# Patient Record
Sex: Female | Born: 1965 | Race: Black or African American | Hispanic: No | Marital: Married | Smoking: Never smoker
Health system: Southern US, Community
[De-identification: ages and names within clinical notes are randomized; demographics above are authoritative.]

## PROBLEM LIST (undated history)

## (undated) DIAGNOSIS — G43909 Migraine, unspecified, not intractable, without status migrainosus: Secondary | ICD-10-CM

## (undated) DIAGNOSIS — E669 Obesity, unspecified: Secondary | ICD-10-CM

## (undated) DIAGNOSIS — D259 Leiomyoma of uterus, unspecified: Secondary | ICD-10-CM

## (undated) DIAGNOSIS — G4733 Obstructive sleep apnea (adult) (pediatric): Secondary | ICD-10-CM

## (undated) DIAGNOSIS — O3410 Maternal care for benign tumor of corpus uteri, unspecified trimester: Secondary | ICD-10-CM

## (undated) DIAGNOSIS — K219 Gastro-esophageal reflux disease without esophagitis: Secondary | ICD-10-CM

## (undated) DIAGNOSIS — J40 Bronchitis, not specified as acute or chronic: Secondary | ICD-10-CM

## (undated) DIAGNOSIS — N83209 Unspecified ovarian cyst, unspecified side: Secondary | ICD-10-CM

## (undated) HISTORY — DX: Obesity, unspecified: E66.9

## (undated) HISTORY — DX: Bronchitis, not specified as acute or chronic: J40

## (undated) HISTORY — DX: Obstructive sleep apnea (adult) (pediatric): G47.33

---

## 2002-09-26 ENCOUNTER — Ambulatory Visit (HOSPITAL_COMMUNITY): Admission: RE | Admit: 2002-09-26 | Discharge: 2002-09-26 | Payer: Self-pay | Admitting: Internal Medicine

## 2002-09-26 ENCOUNTER — Encounter: Payer: Self-pay | Admitting: Internal Medicine

## 2003-12-12 ENCOUNTER — Other Ambulatory Visit: Admission: RE | Admit: 2003-12-12 | Discharge: 2003-12-12 | Payer: Self-pay | Admitting: Internal Medicine

## 2004-02-14 ENCOUNTER — Emergency Department (HOSPITAL_COMMUNITY): Admission: EM | Admit: 2004-02-14 | Discharge: 2004-02-15 | Payer: Self-pay | Admitting: Emergency Medicine

## 2004-04-02 ENCOUNTER — Ambulatory Visit: Payer: Self-pay | Admitting: Internal Medicine

## 2004-04-16 ENCOUNTER — Ambulatory Visit: Payer: Self-pay | Admitting: Internal Medicine

## 2004-05-06 ENCOUNTER — Ambulatory Visit: Payer: Self-pay | Admitting: Internal Medicine

## 2004-05-06 ENCOUNTER — Ambulatory Visit: Payer: Self-pay | Admitting: *Deleted

## 2004-09-28 ENCOUNTER — Emergency Department (HOSPITAL_COMMUNITY): Admission: EM | Admit: 2004-09-28 | Discharge: 2004-09-28 | Payer: Self-pay | Admitting: Emergency Medicine

## 2006-11-29 ENCOUNTER — Encounter: Admission: RE | Admit: 2006-11-29 | Discharge: 2006-11-29 | Payer: Self-pay | Admitting: Cardiology

## 2009-10-10 ENCOUNTER — Inpatient Hospital Stay (HOSPITAL_COMMUNITY): Admission: AD | Admit: 2009-10-10 | Discharge: 2009-10-10 | Payer: Self-pay | Admitting: Obstetrics & Gynecology

## 2009-10-12 ENCOUNTER — Ambulatory Visit: Payer: Self-pay | Admitting: Family

## 2009-10-12 ENCOUNTER — Inpatient Hospital Stay (HOSPITAL_COMMUNITY): Admission: AD | Admit: 2009-10-12 | Discharge: 2009-10-13 | Payer: Self-pay | Admitting: Obstetrics & Gynecology

## 2009-10-26 DEATH — deceased

## 2009-11-01 ENCOUNTER — Ambulatory Visit: Payer: Self-pay | Admitting: Obstetrics & Gynecology

## 2009-11-02 ENCOUNTER — Encounter: Payer: Self-pay | Admitting: Obstetrics & Gynecology

## 2009-11-02 LAB — CONVERTED CEMR LAB
Trich, Wet Prep: NONE SEEN
Yeast Wet Prep HPF POC: NONE SEEN

## 2010-08-18 ENCOUNTER — Encounter: Payer: Self-pay | Admitting: Obstetrics & Gynecology

## 2010-10-16 ENCOUNTER — Inpatient Hospital Stay (HOSPITAL_COMMUNITY)
Admission: AD | Admit: 2010-10-16 | Discharge: 2010-10-16 | Disposition: A | Discharge status: Home / Self Care | Payer: BC Managed Care – PPO | Source: Ambulatory Visit | Attending: Obstetrics and Gynecology | Admitting: Obstetrics and Gynecology

## 2010-10-16 DIAGNOSIS — O99891 Other specified diseases and conditions complicating pregnancy: Secondary | ICD-10-CM | POA: Insufficient documentation

## 2010-10-16 DIAGNOSIS — O09529 Supervision of elderly multigravida, unspecified trimester: Secondary | ICD-10-CM | POA: Insufficient documentation

## 2010-10-16 DIAGNOSIS — N949 Unspecified condition associated with female genital organs and menstrual cycle: Secondary | ICD-10-CM | POA: Insufficient documentation

## 2010-10-16 LAB — URINALYSIS, ROUTINE W REFLEX MICROSCOPIC
Glucose, UA: NEGATIVE mg/dL
Nitrite: NEGATIVE
Urobilinogen, UA: 0.2 mg/dL (ref 0.0–1.0)
pH: 6 (ref 5.0–8.0)

## 2010-10-17 LAB — URINE CULTURE

## 2010-10-21 LAB — CBC
HCT: 31.6 % — ABNORMAL LOW (ref 36.0–46.0)
HCT: 32.5 % — ABNORMAL LOW (ref 36.0–46.0)
Hemoglobin: 11 g/dL — ABNORMAL LOW (ref 12.0–15.0)
MCHC: 33.7 g/dL (ref 30.0–36.0)
MCHC: 33.9 g/dL (ref 30.0–36.0)
MCV: 96 fL (ref 78.0–100.0)
Platelets: 244 10*3/uL (ref 150–400)
RBC: 3.29 MIL/uL — ABNORMAL LOW (ref 3.87–5.11)
RDW: 12.2 % (ref 11.5–15.5)
WBC: 5.7 10*3/uL (ref 4.0–10.5)

## 2010-10-21 LAB — URINALYSIS, ROUTINE W REFLEX MICROSCOPIC
Glucose, UA: 500 mg/dL — AB
Leukocytes, UA: NEGATIVE
Nitrite: NEGATIVE
Protein, ur: NEGATIVE mg/dL
Urobilinogen, UA: 0.2 mg/dL (ref 0.0–1.0)

## 2010-10-21 LAB — WET PREP, GENITAL: Trich, Wet Prep: NONE SEEN

## 2010-10-21 LAB — HCG, QUANTITATIVE, PREGNANCY: hCG, Beta Chain, Quant, S: 954 m[IU]/mL — ABNORMAL HIGH (ref <5)

## 2010-10-21 LAB — URINE MICROSCOPIC-ADD ON

## 2010-11-08 ENCOUNTER — Inpatient Hospital Stay (HOSPITAL_COMMUNITY)
Admission: AD | Admit: 2010-11-08 | Discharge: 2010-11-09 | Disposition: A | Discharge status: Home / Self Care | Payer: BC Managed Care – PPO | Source: Ambulatory Visit | Attending: Obstetrics and Gynecology | Admitting: Obstetrics and Gynecology

## 2010-11-08 DIAGNOSIS — D259 Leiomyoma of uterus, unspecified: Secondary | ICD-10-CM | POA: Insufficient documentation

## 2010-11-08 DIAGNOSIS — R109 Unspecified abdominal pain: Secondary | ICD-10-CM | POA: Insufficient documentation

## 2010-11-08 DIAGNOSIS — O3410 Maternal care for benign tumor of corpus uteri, unspecified trimester: Secondary | ICD-10-CM | POA: Insufficient documentation

## 2010-11-09 ENCOUNTER — Inpatient Hospital Stay (HOSPITAL_COMMUNITY): Payer: BC Managed Care – PPO

## 2010-11-09 LAB — URINALYSIS, ROUTINE W REFLEX MICROSCOPIC
Bilirubin Urine: NEGATIVE
Glucose, UA: 100 mg/dL — AB
Hgb urine dipstick: NEGATIVE
Nitrite: NEGATIVE
Specific Gravity, Urine: >1.03 — ABNORMAL HIGH (ref 1.005–1.030)
Urobilinogen, UA: 0.2 mg/dL (ref 0.0–1.0)

## 2010-11-19 ENCOUNTER — Other Ambulatory Visit: Payer: Self-pay | Admitting: Obstetrics and Gynecology

## 2010-11-19 DIAGNOSIS — R748 Abnormal levels of other serum enzymes: Secondary | ICD-10-CM

## 2010-11-20 ENCOUNTER — Ambulatory Visit
Admission: RE | Admit: 2010-11-20 | Discharge: 2010-11-20 | Disposition: A | Discharge status: Home / Self Care | Payer: BC Managed Care – PPO | Source: Ambulatory Visit | Attending: Obstetrics and Gynecology | Admitting: Obstetrics and Gynecology

## 2010-11-20 DIAGNOSIS — R748 Abnormal levels of other serum enzymes: Secondary | ICD-10-CM

## 2011-01-30 ENCOUNTER — Inpatient Hospital Stay (HOSPITAL_COMMUNITY)
Admission: EM | Admit: 2011-01-30 | Discharge: 2011-02-04 | DRG: 651 | Disposition: A | Discharge status: Home / Self Care | Payer: BC Managed Care – PPO | Source: Ambulatory Visit | Attending: Obstetrics and Gynecology | Admitting: Obstetrics and Gynecology

## 2011-01-30 DIAGNOSIS — O09529 Supervision of elderly multigravida, unspecified trimester: Secondary | ICD-10-CM | POA: Diagnosis present

## 2011-01-30 DIAGNOSIS — D4959 Neoplasm of unspecified behavior of other genitourinary organ: Secondary | ICD-10-CM | POA: Diagnosis present

## 2011-01-30 DIAGNOSIS — O1495 Unspecified pre-eclampsia, complicating the puerperium: Secondary | ICD-10-CM | POA: Diagnosis present

## 2011-01-30 DIAGNOSIS — IMO0002 Reserved for concepts with insufficient information to code with codable children: Principal | ICD-10-CM | POA: Diagnosis present

## 2011-01-30 DIAGNOSIS — D259 Leiomyoma of uterus, unspecified: Secondary | ICD-10-CM | POA: Diagnosis present

## 2011-01-30 DIAGNOSIS — O34599 Maternal care for other abnormalities of gravid uterus, unspecified trimester: Secondary | ICD-10-CM | POA: Diagnosis present

## 2011-01-30 DIAGNOSIS — O1494 Unspecified pre-eclampsia, complicating childbirth: Secondary | ICD-10-CM

## 2011-01-30 HISTORY — DX: Unspecified ovarian cyst, unspecified side: N83.209

## 2011-01-30 HISTORY — DX: Leiomyoma of uterus, unspecified: D25.9

## 2011-01-30 HISTORY — DX: Maternal care for benign tumor of corpus uteri, unspecified trimester: O34.10

## 2011-01-30 HISTORY — DX: Gastro-esophageal reflux disease without esophagitis: K21.9

## 2011-01-30 LAB — COMPREHENSIVE METABOLIC PANEL
Albumin: 2.7 g/dL — ABNORMAL LOW (ref 3.5–5.2)
Alkaline Phosphatase: 142 U/L — ABNORMAL HIGH (ref 39–117)
Calcium: 9.9 mg/dL (ref 8.4–10.5)
Chloride: 105 mEq/L (ref 96–112)
Creatinine, Ser: 0.92 mg/dL (ref 0.50–1.10)
GFR calc non Af Amer: >60 mL/min (ref >60)
Potassium: 4 mEq/L (ref 3.5–5.1)
Total Bilirubin: 0.2 mg/dL — ABNORMAL LOW (ref 0.3–1.2)
Total Protein: 6.3 g/dL (ref 6.0–8.3)

## 2011-01-30 LAB — CBC
HCT: 34.5 % — ABNORMAL LOW (ref 36.0–46.0)
MCH: 31 pg (ref 26.0–34.0)
MCV: 93 fL (ref 78.0–100.0)
RBC: 3.71 MIL/uL — ABNORMAL LOW (ref 3.87–5.11)
RDW: 12.9 % (ref 11.5–15.5)
WBC: 7.6 10*3/uL (ref 4.0–10.5)

## 2011-01-31 ENCOUNTER — Other Ambulatory Visit: Payer: Self-pay | Admitting: Obstetrics and Gynecology

## 2011-01-31 LAB — COMPREHENSIVE METABOLIC PANEL
ALT: 13 U/L (ref 0–35)
AST: 14 U/L (ref 0–37)
Albumin: 2.2 g/dL — ABNORMAL LOW (ref 3.5–5.2)
Alkaline Phosphatase: 117 U/L (ref 39–117)
Calcium: 9.2 mg/dL (ref 8.4–10.5)
GFR calc Af Amer: >60 mL/min (ref >60)
Glucose, Bld: 72 mg/dL (ref 70–99)
Potassium: 3.6 mEq/L (ref 3.5–5.1)
Sodium: 136 mEq/L (ref 135–145)
Total Protein: 5.9 g/dL — ABNORMAL LOW (ref 6.0–8.3)

## 2011-01-31 LAB — CREATININE CLEARANCE, URINE, 24 HOUR
Collection Interval-CRCL: 24 hours
Creatinine Clearance: 138 mL/min — ABNORMAL HIGH (ref 75–115)
Creatinine, 24H Ur: 1867 mg/d — ABNORMAL HIGH (ref 700–1800)
Creatinine, Urine: 116.7 mg/dL
Creatinine: 0.94 mg/dL (ref 0.50–1.10)

## 2011-01-31 LAB — CBC
HCT: 33.3 % — ABNORMAL LOW (ref 36.0–46.0)
Hemoglobin: 10.5 g/dL — ABNORMAL LOW (ref 12.0–15.0)
Hemoglobin: 11.3 g/dL — ABNORMAL LOW (ref 12.0–15.0)
MCH: 31.6 pg (ref 26.0–34.0)
MCHC: 33.4 g/dL (ref 30.0–36.0)
MCHC: 33.9 g/dL (ref 30.0–36.0)
MCV: 93 fL (ref 78.0–100.0)
Platelets: 189 10*3/uL (ref 150–400)
RDW: 12.8 % (ref 11.5–15.5)

## 2011-01-31 LAB — TYPE AND SCREEN: Antibody Screen: NEGATIVE

## 2011-01-31 MED ORDER — FLORA-Q PO CAPS
1.0000 | ORAL_CAPSULE | Freq: Every day | ORAL | Status: DC
  Filled 2011-01-31: qty 1

## 2011-01-31 MED ORDER — POLYSACCHARIDE IRON 150 MG PO CAPS
150.0000 mg | ORAL_CAPSULE | Freq: Two times a day (BID) | ORAL | Status: DC
  Filled 2011-01-31: qty 1

## 2011-01-31 MED ORDER — PRENATAL PLUS 27-1 MG PO TABS: 1.0000 | ORAL_TABLET | Freq: Every day | ORAL | Status: DC

## 2011-01-31 MED ORDER — MAGNESIUM SULFATE 40 G IN LACTATED RINGERS - SIMPLE
2.0000 g/h | INTRAVENOUS | Status: DC
  Filled 2011-01-31: qty 500

## 2011-01-31 MED ORDER — LABETALOL HCL 100 MG PO TABS
100.0000 mg | ORAL_TABLET | Freq: Two times a day (BID) | ORAL | Status: DC
  Filled 2011-01-31: qty 1

## 2011-01-31 MED ORDER — PANTOPRAZOLE SODIUM 40 MG PO TBEC
40.0000 mg | DELAYED_RELEASE_TABLET | Freq: Two times a day (BID) | ORAL | Status: DC
  Filled 2011-01-31: qty 1

## 2011-01-31 MED ORDER — DOCUSATE SODIUM 100 MG PO CAPS: 100.0000 mg | ORAL_CAPSULE | Freq: Every day | ORAL | Status: DC

## 2011-01-31 MED ORDER — DEXTROSE IN LACTATED RINGERS 5 % IV SOLN: INTRAVENOUS | Status: DC

## 2011-01-31 MED ORDER — ZOLPIDEM TARTRATE 10 MG PO TABS: 10.0000 mg | ORAL_TABLET | Freq: Every evening | ORAL | Status: DC | PRN

## 2011-01-31 MED ORDER — SODIUM CHLORIDE 0.9 % IJ SOLN
3.0000 mL | Freq: Two times a day (BID) | INTRAMUSCULAR | Status: DC
  Filled 2011-01-31: qty 3

## 2011-01-31 MED ORDER — OXYTOCIN 20 UNITS IN LACTATED RINGERS INFUSION - SIMPLE: 1.0000 m[IU]/min | Freq: Once | INTRAVENOUS | Status: DC

## 2011-01-31 MED ORDER — FAMOTIDINE 20 MG PO TABS: 20.0000 mg | ORAL_TABLET | Freq: Two times a day (BID) | ORAL | Status: DC

## 2011-01-31 MED ORDER — CALCIUM CARBONATE ANTACID 500 MG PO CHEW: 2.0000 | CHEWABLE_TABLET | ORAL | Status: DC | PRN

## 2011-01-31 MED ORDER — ACETAMINOPHEN 325 MG PO TABS: 650.0000 mg | ORAL_TABLET | ORAL | Status: DC | PRN

## 2011-01-31 MED ORDER — ONDANSETRON HCL 4 MG PO TABS: 8.0000 mg | ORAL_TABLET | Freq: Two times a day (BID) | ORAL | Status: DC | PRN

## 2011-01-31 MED ORDER — SODIUM CHLORIDE 0.9 % IV SOLN: 8.0000 mg | Freq: Two times a day (BID) | INTRAVENOUS | Status: DC

## 2011-02-01 DIAGNOSIS — O1495 Unspecified pre-eclampsia, complicating the puerperium: Secondary | ICD-10-CM | POA: Diagnosis present

## 2011-02-01 DIAGNOSIS — O1494 Unspecified pre-eclampsia, complicating childbirth: Secondary | ICD-10-CM | POA: Diagnosis present

## 2011-02-01 LAB — CBC
HCT: 32.9 % — ABNORMAL LOW (ref 36.0–46.0)
MCHC: 33.7 g/dL (ref 30.0–36.0)
MCV: 92.4 fL (ref 78.0–100.0)
RDW: 12.6 % (ref 11.5–15.5)

## 2011-02-01 LAB — MRSA PCR SCREENING: MRSA by PCR: NEGATIVE

## 2011-02-01 MED ORDER — MENTHOL 3 MG MT LOZG: 1.0000 | LOZENGE | OROMUCOSAL | Status: DC | PRN

## 2011-02-01 MED ORDER — GUAIFENESIN 100 MG/5ML PO SOLN: 15.0000 mL | ORAL | Status: DC | PRN

## 2011-02-01 MED ORDER — ZOLPIDEM TARTRATE 5 MG PO TABS: 5.0000 mg | ORAL_TABLET | Freq: Every evening | ORAL | Status: DC | PRN

## 2011-02-01 MED ORDER — ONDANSETRON HCL 4 MG/2ML IJ SOLN: 4.0000 mg | Freq: Three times a day (TID) | INTRAMUSCULAR | Status: DC | PRN

## 2011-02-01 MED ORDER — DIPHENHYDRAMINE HCL 25 MG PO CAPS: 25.0000 mg | ORAL_CAPSULE | ORAL | Status: DC | PRN

## 2011-02-01 MED ORDER — IBUPROFEN 600 MG PO TABS
600.0000 mg | ORAL_TABLET | Freq: Four times a day (QID) | ORAL | Status: DC
  Administered 2011-02-01 – 2011-02-03 (×9): 600 mg via ORAL
  Filled 2011-02-01 (×2): qty 1

## 2011-02-01 MED ORDER — KETOROLAC TROMETHAMINE 30 MG/ML IJ SOLN: 30.0000 mg | Freq: Four times a day (QID) | INTRAMUSCULAR | Status: AC | PRN

## 2011-02-01 MED ORDER — RHO D IMMUNE GLOBULIN 1500 UNIT/2ML IJ SOLN
300.0000 ug | Freq: Once | INTRAMUSCULAR | Status: DC
  Filled 2011-02-01: qty 2

## 2011-02-01 MED ORDER — NALBUPHINE HCL 10 MG/ML IJ SOLN
5.0000 mg | INTRAMUSCULAR | Status: AC | PRN
  Filled 2011-02-01: qty 1

## 2011-02-01 MED ORDER — DIBUCAINE 1 % RE OINT: TOPICAL_OINTMENT | RECTAL | Status: DC | PRN

## 2011-02-01 MED ORDER — NALOXONE HCL 0.4 MG/ML IJ SOLN
1.0000 ug/kg/h | INTRAVENOUS | Status: DC | PRN
  Filled 2011-02-01: qty 2.5

## 2011-02-01 MED ORDER — DIPHENHYDRAMINE HCL 50 MG/ML IJ SOLN: 25.0000 mg | INTRAMUSCULAR | Status: DC | PRN

## 2011-02-01 MED ORDER — DIPHENHYDRAMINE HCL 25 MG PO CAPS
25.0000 mg | ORAL_CAPSULE | Freq: Four times a day (QID) | ORAL | Status: DC | PRN
Start: 2011-02-01 — End: 2011-02-04

## 2011-02-01 MED ORDER — MAGNESIUM SULFATE 40 G IN LACTATED RINGERS - SIMPLE
2.0000 g/h | INTRAVENOUS | Status: DC
  Administered 2011-02-01 – 2011-02-03 (×4): 2 g/h via INTRAVENOUS
  Filled 2011-02-01 (×2): qty 500

## 2011-02-01 MED ORDER — LACTATED RINGERS IV SOLN
INTRAVENOUS | Status: DC
  Administered 2011-02-01 – 2011-02-02 (×3): via INTRAVENOUS

## 2011-02-01 MED ORDER — CALCIUM CARBONATE ANTACID 500 MG PO CHEW: 1.0000 | CHEWABLE_TABLET | ORAL | Status: DC | PRN

## 2011-02-01 MED ORDER — LABETALOL HCL 100 MG PO TABS
100.0000 mg | ORAL_TABLET | Freq: Two times a day (BID) | ORAL | Status: DC
  Administered 2011-02-01 – 2011-02-02 (×4): 100 mg via ORAL
  Filled 2011-02-01 (×5): qty 1

## 2011-02-01 MED ORDER — SCOPOLAMINE 1 MG/3DAYS TD PT72
1.0000 | MEDICATED_PATCH | Freq: Once | TRANSDERMAL | Status: DC
  Filled 2011-02-01: qty 1

## 2011-02-01 MED ORDER — NALOXONE HCL 0.4 MG/ML IJ SOLN: 0.4000 mg | INTRAMUSCULAR | Status: DC | PRN

## 2011-02-01 MED ORDER — TETANUS-DIPHTH-ACELL PERTUSSIS 5-2.5-18.5 LF-MCG/0.5 IM SUSP
0.5000 mL | Freq: Once | INTRAMUSCULAR | Status: AC
  Administered 2011-02-02: 0.5 mL via INTRAMUSCULAR
  Filled 2011-02-01 (×2): qty 0.5

## 2011-02-01 MED ORDER — ONDANSETRON HCL 4 MG PO TABS: 4.0000 mg | ORAL_TABLET | ORAL | Status: DC | PRN

## 2011-02-01 MED ORDER — DIPHENHYDRAMINE HCL 50 MG/ML IJ SOLN: 12.5000 mg | INTRAMUSCULAR | Status: DC | PRN

## 2011-02-01 MED ORDER — CEFAZOLIN SODIUM 1-5 GM-% IV SOLN
1.0000 g | Freq: Three times a day (TID) | INTRAVENOUS | Status: AC
  Administered 2011-02-01 (×2): 1 g via INTRAVENOUS
  Filled 2011-02-01 (×2): qty 50

## 2011-02-01 MED ORDER — WITCH HAZEL-GLYCERIN EX PADS
MEDICATED_PAD | CUTANEOUS | Status: DC | PRN
  Filled 2011-02-01: qty 100

## 2011-02-01 MED ORDER — ONDANSETRON HCL 4 MG/2ML IJ SOLN: 4.0000 mg | INTRAMUSCULAR | Status: DC | PRN

## 2011-02-01 MED ORDER — SIMETHICONE 80 MG PO CHEW
80.0000 mg | CHEWABLE_TABLET | Freq: Three times a day (TID) | ORAL | Status: DC
  Administered 2011-02-01 – 2011-02-04 (×11): 80 mg via ORAL
  Filled 2011-02-01 (×6): qty 1

## 2011-02-01 MED ORDER — SENNOSIDES-DOCUSATE SODIUM 8.6-50 MG PO TABS
1.0000 | ORAL_TABLET | Freq: Every day | ORAL | Status: DC
  Administered 2011-02-01 – 2011-02-02 (×2): 1 via ORAL
  Filled 2011-02-01 (×3): qty 2

## 2011-02-01 MED ORDER — SIMETHICONE 80 MG PO CHEW
80.0000 mg | CHEWABLE_TABLET | ORAL | Status: DC | PRN
  Administered 2011-02-02: 80 mg via ORAL
  Filled 2011-02-01: qty 1

## 2011-02-01 MED ORDER — MEPERIDINE HCL 25 MG/ML IJ SOLN: 6.2500 mg | INTRAMUSCULAR | Status: DC | PRN

## 2011-02-01 MED ORDER — KETOROLAC TROMETHAMINE 60 MG/2ML IM SOLN
60.0000 mg | Freq: Once | INTRAMUSCULAR | Status: AC | PRN
  Filled 2011-02-01: qty 2

## 2011-02-01 MED ORDER — METOCLOPRAMIDE HCL 5 MG/ML IJ SOLN: 10.0000 mg | Freq: Three times a day (TID) | INTRAMUSCULAR | Status: DC | PRN

## 2011-02-01 MED ORDER — SODIUM CHLORIDE 0.9 % IJ SOLN
3.0000 mL | INTRAMUSCULAR | Status: DC | PRN
  Administered 2011-02-03: 3 mL via INTRAVENOUS
  Filled 2011-02-01: qty 3

## 2011-02-01 MED ORDER — IBUPROFEN 600 MG PO TABS
600.0000 mg | ORAL_TABLET | Freq: Four times a day (QID) | ORAL | Status: DC | PRN
  Administered 2011-02-03 – 2011-02-04 (×4): 600 mg via ORAL
  Filled 2011-02-01 (×7): qty 1

## 2011-02-01 MED ORDER — HYDROCORTISONE 1 % EX CREA
TOPICAL_CREAM | Freq: Four times a day (QID) | CUTANEOUS | Status: DC | PRN
  Filled 2011-02-01: qty 28

## 2011-02-01 MED ORDER — PRENATAL PLUS 27-1 MG PO TABS
1.0000 | ORAL_TABLET | Freq: Every day | ORAL | Status: DC
  Administered 2011-02-01 – 2011-02-04 (×4): 1 via ORAL
  Filled 2011-02-01 (×4): qty 1

## 2011-02-01 MED ORDER — OXYCODONE-ACETAMINOPHEN 5-325 MG PO TABS
1.0000 | ORAL_TABLET | ORAL | Status: DC | PRN
  Administered 2011-02-01 – 2011-02-02 (×5): 1 via ORAL
  Administered 2011-02-02 – 2011-02-03 (×2): 2 via ORAL
  Administered 2011-02-03 – 2011-02-04 (×3): 1 via ORAL
  Filled 2011-02-01 (×8): qty 1
  Filled 2011-02-01: qty 2
  Filled 2011-02-01: qty 1

## 2011-02-01 MED ORDER — DEXTROSE IN LACTATED RINGERS 5 % IV SOLN
20.0000 [IU] | INTRAVENOUS | Status: AC
  Administered 2011-01-31: 20 [IU] via INTRAVENOUS

## 2011-02-01 MED ORDER — PANTOPRAZOLE SODIUM 40 MG IV SOLR
40.0000 mg | Freq: Two times a day (BID) | INTRAVENOUS | Status: DC
  Administered 2011-02-01 (×2): 40 mg via INTRAVENOUS
  Filled 2011-02-01 (×3): qty 40

## 2011-02-01 MED ORDER — DEXTROSE IN LACTATED RINGERS 5 % IV SOLN: INTRAVENOUS | Status: DC

## 2011-02-01 MED ORDER — PRAMOXINE HCL 1 % RE FOAM
RECTAL | Status: DC | PRN
  Filled 2011-02-01: qty 15

## 2011-02-01 NOTE — Op Note (Signed)
Laurie Perry, Laurie Perry              ACCOUNT NO.:  [id number]  MEDICAL RECORD NO.:  [id number]  LOCATION:  9374                          FACILITY:  [facility]  PHYSICIAN:  Maxie Better, M.D.DATE OF BIRTH:  January 18, 1966  DATE OF PROCEDURE:  01/31/2011 DATE OF DISCHARGE:                              OPERATIVE REPORT   PREOPERATIVE DIAGNOSES: 1. Failed oxytocin challenge test. 2. Preeclampsia. 3. Fibroid uterus. 4. Intrauterine gestation at 36 weeks.  POSTOPERATIVE DIAGNOSES: 1. Fibroid uterus. 2. Failed oxytocin challenge test. 3. Intrauterine gestation at 36 weeks. 4. Preeclampsia.  PROCEDURE:   Primary cesarean section, Kerr hysterotomy.  ANESTHESIA:  Spinal.  SURGEON:  Maxie Better, MD  ASSISTANT:  Genia Del, MD.  INDICATIONS:  A 45 year old gravida 2, para 0-0-1-0 married black female with known fibroid uterus at 36 weeks, admitted with intermittent decelerations and elevated blood pressures.  The patient required labetalol for blood pressure management.  She had PIH labs that were normal, however, the patient had a 24-hour urine that showed 304 mg of protein in 24-hour period.  The patient also continued to have decelerations lasting 1-3 minutes, some with and some without contractions.  Given the findings, consultation by phone on maternal fetal medicine concur with a decision to proceed with a delivery.  The patient had oxytocin challenge test in order to determine whether or not she was a candidate for cervical ripening.  The patient failed oxytocin challenge test, and was therefore transferred to the operating room for cesarean section.  Risk of procedure was explained to the patient and her husband, consent was signed.  The patient was transferred to the operating room.  PROCEDURE:  Under adequate spinal anesthesia, the patient was placed in the supine position with a left lateral tilt.  She was sterilely prepped and draped in the usual fashion.   Indwelling Foley catheter was already in place.  The patient was on magnesium sulfate.  Marcaine 0.25% was injected in the planned Pfannenstiel skin incision site.  Pfannenstiel skin incision was then made, carried down to the rectus fascia.  The rectus fascia was opened transversely.  Rectus fascia was then bluntly and sharply dissected off the rectus muscle in superior and inferior fashion.  The rectus muscle was split in midline.  The parietal peritoneum was entered bluntly and extended.  The vesicouterine peritoneum was opened transversely.  The bladder was bluntly dissected off the lower uterine segment, displaced inferiorly with bladder retractor.  A curvilinear low transverse uterine incision was then made and extended with bandage scissors.  Artificial rupture of membranes occurred, light meconium fluid was noted.  Loops of cord was also noted in the field.  Subsequent delivery of a live female was accomplished.  The baby was bulb suctioned. The abdominal cord was clamped and cut.  The baby was transferred to the awaiting pediatrician who assigned Apgars 8 of 1 minutes and 8 of 5 minutes.  The placenta was spontaneous, small, intact and sent to pathology.  Uterine cavity was cleaned of debris. Uterine incision had no extension.  Uterine incision was closed in 2 layers, the first layer with 0 Monocryl running locked stitch and second layer is imbricating using 0 Monocryl sutures.  Multiple  subserosal fibroids were noted, particularly one in the left fundal region, for which omental adhesion was adherent.  Adhesion was then lysed off of that fibroid.  Both tubes and ovaries were noted to be normal.  The abdomen was then copiously irrigated and suctioned debris.  The uterine incision had good hemostasis.  The parietal peritoneum was then closed with 2-0 Vicryl.  The rectus fascia was closed with 0 Vicryl x2.  The subcutaneous area was irrigated, small bleeders cauterized.   Interrupted 2-0 plain sutures placed and the skin approximated using Ethicon staples.  SPECIMENS:  Placenta sent to pathology.  ESTIMATED BLOOD LOSS:  650 mL.  INTRAOPERATIVE FLUIDS:  600 mL.  URINE OUTPUT:  200 mL.  Sponge and instrument counts x2 was correct.  COMPLICATIONS:  None.  The baby was transferred to the regular nursery for weight and determination as to whether or not that he will be needed to transfer to the intensive care.  The patient tolerated procedure well, was transferred to recovery in stable condition.     Maxie Better, M.D.     Wallace/MEDQ  D:  01/31/2011  T:  02/01/2011  Job:  [id number]

## 2011-02-01 NOTE — Progress Notes (Signed)
Mother has been bottle feeding.  Desires to also try BF.  Educated on Supply and demand.  Attempted to latch but just ate 20ml.  Instructed to feed less formula (10ml today, 15ml tomorrow)RN to set up DEBP.

## 2011-02-01 NOTE — Progress Notes (Signed)
Subjective: Postpartum Day 1: Cesarean Delivery Patient reports : comfortable, tol PO, denies HA, N/V, epigastric pain, or visual changes. Bleed small. No flatus yet. Up at bedside with assist X1 this AM with slight dizziness resolving after a few seconds.  Objective: Vital signs in last 24 hours: Temp:  [97.8 F (36.6 C)-98.1 F (36.7 C)] 97.8 F (36.6 C) (07/07 1200) Pulse Rate:  [70-83] 76  (07/07 1200) Resp:  [18-20] 18  (07/07 1200) BP: (127-154)/(64-100) 127/78 mmHg (07/07 1200) SpO2:  [96 %-99 %] 96 % (07/07 1200) Weight:  [82.192 kg (181 lb 3.2 oz)-84.369 kg (186 lb)] 181 lb 3.2 oz (82.192 kg) (07/07 0600) I/O this shift: In: 594 [P.O.:594] Out: 300 [Urine:300]   Net I&O (+)1106cc   Physical Exam:  General: cooperative, no distress, mildly obese and appears tired Generalized edema CV: RRR Pulm: CTAB Abd: soft, NT, hypoactive BS Lochia: small Uterine Fundus: firm, U+1 , dev to L (c/w left fibroid) Incision: DD&I to LST DVT Evaluation: No evidence of DVT seen on physical exam. Negative Homan's sign. DTR's +1, no clonus    Lab Results  Component Value Date   WBC 11.5* 02/01/2011   HGB 11.1* 02/01/2011   HCT 32.9* 02/01/2011   MCV 92.4 02/01/2011   PLT 192 02/01/2011   Assessment/Plan: Status post Cesarean section. POD #1 in AICU s/p P C/S at 36 wks, PEC, (+) OCT, uterine fibroids  Postoperative course complicated by PEC on Mag Sulfate and LAbetalol - stable Minimal diuresis, continue Mag Sulfate and reevaluate after 24 hrs. Advance routine orders  Continue current care. Dr. Cherly Hensen consult - agrees.  PAUL,DANIELA 02/01/2011, 12:05 PM

## 2011-02-02 ENCOUNTER — Encounter (HOSPITAL_COMMUNITY): Payer: Self-pay | Admitting: *Deleted

## 2011-02-02 LAB — RPR: RPR Ser Ql: NONREACTIVE

## 2011-02-02 MED ORDER — FUROSEMIDE 10 MG/ML IJ SOLN
20.0000 mg | Freq: Once | INTRAMUSCULAR | Status: AC
  Administered 2011-02-02: 20 mg via INTRAVENOUS
  Filled 2011-02-02: qty 2

## 2011-02-02 MED ORDER — LABETALOL HCL 200 MG PO TABS
200.0000 mg | ORAL_TABLET | Freq: Two times a day (BID) | ORAL | Status: DC
  Administered 2011-02-03 – 2011-02-04 (×3): 200 mg via ORAL
  Filled 2011-02-02 (×5): qty 1

## 2011-02-02 MED ORDER — HYDROCHLOROTHIAZIDE 25 MG PO TABS
25.0000 mg | ORAL_TABLET | Freq: Every day | ORAL | Status: DC
  Administered 2011-02-03 – 2011-02-04 (×2): 25 mg via ORAL
  Filled 2011-02-02 (×3): qty 1

## 2011-02-02 MED ORDER — PANTOPRAZOLE SODIUM 40 MG PO TBEC
40.0000 mg | DELAYED_RELEASE_TABLET | Freq: Every day | ORAL | Status: DC
  Administered 2011-02-02 – 2011-02-04 (×3): 40 mg via ORAL
  Filled 2011-02-02 (×4): qty 1

## 2011-02-02 NOTE — Plan of Care (Signed)
Problem: Consults Goal: Birthing Suites Patient Information Press F2 to bring up selections list  Outcome: Adequate for Discharge  Pt < [redacted] weeks EGA and PIH (Pregnancy induced hypertension)

## 2011-02-02 NOTE — Progress Notes (Addendum)
  Subjective: Postpartum Day 2: Cesarean Delivery Patient reports no nausea or vomiting, + incisional pain with movement otherwise pain well controlled with PO meds, tolerating PO, (-) flatus and  voiding foley cath.  Denies PEC s/s.  Attempting BFing, lactation support given, supplementing with formula for late preterm infant. Minimal ambulation to date. Reports slight dizziness with initial standing.  Objective: Vital signs in last 24 hours: Temp:  [97.7 F (36.5 C)-98.3 F (36.8 C)] 98.3 F (36.8 C) (07/08 0800) Pulse Rate:  [71-98] 71  (07/08 0800) Resp:  [16-18] 18  (07/08 0800) BP: (115-156)/(55-93) 147/74 mmHg (07/08 0800) SpO2:  [94 %-100 %] 94 % (07/08 0800) Weight:  [82.918 kg (182 lb 12.8 oz)] 182 lb 12.8 oz (82.918 kg) (07/08 0600)  Physical Exam:  General: alert, cooperative, no distress and appears improved over yesterday with ^ energy and affect. CV: RRR  / Pulm: CTAB Abd: soft, appropriate tenderness, (+)[id number] Lochia: appropriate Uterine Fundus: firm and U+1, dev to L, c/w L ant. Fibroid. Incision: DD&I Urine draining to gravity, clear light yellow. DVT Evaluation: No evidence of DVT seen on physical exam. Negative Homan's sign. Calf/Ankle edema is present.   Intake/Output Summary (Last 24 hours) at 02/02/11 0945 Last data filed at 02/02/11 0800  Gross per 24 hour  Intake   3916 ml  Output   4300 ml  Net   -384 ml    Assessment/Plan: Status post Cesarean section. Doing well postoperatively. Diuresing well, PEC resolving, BP well controlled on Labetalol. Will continue w/ Mag Sulfate therapy for now, may consider D/Cing this PM if continues with diuresis. Continue current care. Will D/C foley, encourage ambulation.  Warm fluids for gut motility D/C dressing. Consult w/ Dr. Cherly Hensen - agrees.  PAUL,DANIELA 02/02/2011, 9:45 AM

## 2011-02-03 LAB — COMPREHENSIVE METABOLIC PANEL
ALT: 17 U/L (ref 0–35)
Albumin: 2.4 g/dL — ABNORMAL LOW (ref 3.5–5.2)
Alkaline Phosphatase: 109 U/L (ref 39–117)
Potassium: 3.7 mEq/L (ref 3.5–5.1)
Sodium: 134 mEq/L — ABNORMAL LOW (ref 135–145)
Total Protein: 6.4 g/dL (ref 6.0–8.3)

## 2011-02-03 LAB — MAGNESIUM: Magnesium: 6.1 mg/dL (ref 1.5–2.5)

## 2011-02-03 LAB — PROTEIN, URINE, 24 HOUR: Urine Total Volume-UPROT: 1600 mL

## 2011-02-03 NOTE — Progress Notes (Signed)
Subjective: Postpartum: Cesarean Delivery Patient reports + flatus and + BM.   Ambulatory to BR ad lib. Voiding QS. Desires newborn circ prior to discharge home.  Objective: Vital signs in last 24 hours: Temp:  [97.7 F (36.5 C)-98.3 F (36.8 C)] 97.9 F (36.6 C) (07/09 1204) Pulse Rate:  [71-93] 90  (07/09 1303) Resp:  [16-20] 16  (07/09 1303) BP: (124-171)/(67-113) 148/90 mmHg (07/09 1303) SpO2:  [91 %-100 %] 97 % (07/09 1303) Weight:  [82.736 kg (182 lb 6.4 oz)] 182 lb 6.4 oz (82.736 kg) (07/09 0600) I &O: net negative / outpt 400-450 per void Physical Exam:  General: alert and cooperative Lungs: clear / unlabored Abdomen BS present and active / soft / non-distended  Lochia: appropriate and scant amount Uterine Fundus: firm Incision: healing well DVT Evaluation: Negative Homan's sign. Extremities: pedal edema 3+ bilaterally / pretibial edema 2+ bilaterally   Basename 02/01/11 0521 01/31/11 2230  HGB 11.1* 11.3*  HCT 32.9* 33.3*    Assessment/Plan: Status post Cesarean section.  Postoperative course complicated by preeclampsia - stable status. Continue postoperative care.  Transfer out of AICU today - continue I&O and antihypertensive therapy.  Laurie Perry 02/03/2011, 2:16 PM

## 2011-02-03 NOTE — Progress Notes (Deleted)
Pulse Oximetry

## 2011-02-03 NOTE — Progress Notes (Signed)
Pt sound asleep , pt BR/BO , encouraged family to have mom call for latch assess when latching at the breast .

## 2011-02-03 NOTE — Procedures (Signed)
Critical lab value:  Magnesium 6.1 Dr. Elroy Channel

## 2011-02-03 NOTE — Progress Notes (Signed)
Magnesium drip d/c @ 0800

## 2011-02-03 NOTE — Progress Notes (Signed)
Report called to Thurmond Butts, RN. Pt transferred to [facility] via ambulation. Patient pushed baby in crib with her to her room.

## 2011-02-04 MED ORDER — LABETALOL HCL 200 MG PO TABS: 200.0000 mg | ORAL_TABLET | Freq: Two times a day (BID) | ORAL | Status: DC

## 2011-02-04 MED ORDER — IBUPROFEN 600 MG PO TABS: 600.0000 mg | ORAL_TABLET | Freq: Four times a day (QID) | ORAL | Status: AC | PRN

## 2011-02-04 MED ORDER — HYDROCHLOROTHIAZIDE 25 MG PO TABS: 25.0000 mg | ORAL_TABLET | Freq: Every day | ORAL | Status: DC

## 2011-02-04 MED ORDER — OXYCODONE-ACETAMINOPHEN 5-325 MG PO TABS: 1.0000 | ORAL_TABLET | ORAL | Status: AC | PRN

## 2011-02-04 MED ORDER — PANTOPRAZOLE SODIUM 40 MG PO TBEC: 40.0000 mg | DELAYED_RELEASE_TABLET | Freq: Every day | ORAL | Status: DC

## 2011-02-04 NOTE — Plan of Care (Signed)
Problem: Discharge Progression Outcomes Goal: Remove staples per MD order Outcome: Not Applicable To be removed in the office on Friday.

## 2011-02-04 NOTE — Discharge Summary (Signed)
Patient ID: Laurie Perry MRN: [id number] DOB/AGE: 1965/12/12 45 y.o.  Admit date: 01/30/2011 Discharge date: 02/04/2011  Admission Diagnoses:  [redacted] weeks gestation  Elevated Blood pressure Fetal heart rate decelerations Fibroid uterus  Discharge Diagnoses:  Postoperative cesarean section day 3 Principal Problem:  *Pre-eclampsia, delivered   Prenatal history:  Gravida 2 para 0  at [redacted] weeks gestation with [facility] of 02-21-2011 Prenatal care at [facility] since [redacted] weeks gestation with Cousins as primary provider.  Prenatal Labs: AB+ / antibody screen - negative / Rubella - immune / hepB-Neg / RPR-NR / HIV-NR / electrophoresis - negative  Prenatal course complicated by uterine fibroids, hyperemesis, advanced maternal age, hypertension  Medical History : GERD, fibroids, SAB x 1   Allergies: NKDA / seafood allergy / no latex allergy  Current Medications at time of admission:  prenatal vitamin daily po protonix 40 mg po daily Align OTC tablet daily  Surgical History: none  Intrapartum Course: Oxytocin Contraction Stress test given due to assess fetal status due to recurrent fetal heart rate decelerations - result positive.   Procedures: Cesarean section delivery of female newborn by Dr Cherly Hensen  See operative report for further details  Postoperative / postpartum course:  managed in first 36 hour in AICU on magensium sulfate Labetalol 200 mg BID HCTZ 25 mg po daily  Physical Exam:  VSS: Blood pressure 156/93, pulse 72, temperature 98.5 F (36.9 C), temperature source Oral, resp. rate 18, height 5\' 2"  (1.575 m), weight 82.736 kg (182 lb 6.4 oz), SpO2 99.00%. tmax 99.0  LABS: No results found for this basename: HGB:2,HCT:2 in the last 72 hours  I&O: I/O last 3 completed shifts: In: 3030 [P.O.:2280; I.V.:750] Out: 5725 [Urine:5725]   I/O this shift: In: -  Out: 675 [Urine:675]  Incision:  approximated with staples / no erythema / no ecchymosis / no  drainage Staples: intact for removal day 7 at [employer]  Discharge Instructions: Consults: none Treatments: none  Discharged Condition: stable  Diet:  Discharge Orders    Future Orders Please Complete By Expires   Diet - low sodium heart healthy      Discharge instructions      Comments:   [facility] booklet     Current Discharge Medication List    START taking these medications   Details  hydrochlorothiazide 25 MG tablet Take 1 tablet (25 mg total) by mouth daily.    ibuprofen (ADVIL,MOTRIN) 600 MG tablet Take 1 tablet (600 mg total) by mouth every 6 (six) hours as needed for pain. Qty: 30 tablet    labetalol (NORMODYNE) 200 MG tablet Take 1 tablet (200 mg total) by mouth 2 (two) times daily.    oxyCODONE-acetaminophen (PERCOCET) 5-325 MG per tablet Take 1-2 tablets by mouth every 3 (three) hours as needed (moderate - severe pain). Qty: 30 tablet    pantoprazole (PROTONIX) 40 MG tablet Take 1 tablet (40 mg total) by mouth daily at 12 noon.      CONTINUE these medications which have NOT CHANGED   Details  IRON PO Take 1 tablet by mouth daily. As a supplement     Prenatal Vit-Fe Sulfate-FA (PRENATAL VITAMIN PO) Take 1 tablet by mouth daily. As supplement     Probiotic Product (PROBIOTIC PO) Take 1 tablet by mouth daily.      ranitidine (ZANTAC) 150 MG tablet Take 150 mg by mouth 2 (two) times daily.         Follow-up Information    Follow up with  COUSINS,SHERONETTE A, MD. Make an appointment in 3 days. (staple removal and blood pressure recheck Friday July  13)    Contact information:   [address] [place] [address] [phone]          Disposition: Home or Self Care  Signed: Marlinda Mike 02/04/2011, 1:15 PM

## 2011-02-04 NOTE — Progress Notes (Signed)
  S:         Reports feeling better today             Tolerating po intake / no nausea / no vomiting / positive flatus / positive BM             Bleeding is light             Pain controlled withprescription NSAID's including motrin and narcotic analgesics including percocet             Up ad lib / ambulatory  Newborn breast feeding  / Circumcision requested today prior to discharge   O:  A & O x 3 NAD / no PIH symptoms             VS: Blood pressure 156/93, pulse 72, temperature 98.5 F (36.9 C), temperature source Oral, resp. rate 18, height 5\' 2"  (1.575 m), weight 82.736 kg (182 lb 6.4 oz), SpO2 99.00%.  LABS: No results found for this basename: HGB:2,HCT:2 in the last 72 hours  I&O: I/O last 3 completed shifts: In: 3030 [P.O.:2280; I.V.:750] Out: 5725 [Urine:5725]      Lungs: Clear and unlabored  Heart: regular rate and rhythm / no mumurs  Abdomen: soft, non-tender, non-distended              Fundus: firm, non-tender, U- even             Dressing discarded              Incision:  approximated with staples / no erythema / no ecchymosis / no drainage  Perineum: intact  Lochia: light  Extremities: 1+ edema, no calf pain or tenderness, negative Homans  A:        POD # 3 S/P cesaeran section - repeat            Preeclampsia - delivered - stable status         P:        Discharge home today [facility] discharge booklet - instructions reviewed OV Friday for staple removal - BP recheck Continue labetalol and HCTZ      Ligaya Cormier 02/04/2011, 10:38 AM

## 2011-02-04 NOTE — Progress Notes (Signed)
UR Chart review completed.  

## 2011-03-04 ENCOUNTER — Emergency Department (HOSPITAL_COMMUNITY)
Admission: EM | Admit: 2011-03-04 | Discharge: 2011-03-04 | Disposition: A | Discharge status: Home / Self Care | Payer: BC Managed Care – PPO | Attending: Emergency Medicine | Admitting: Emergency Medicine

## 2011-03-04 ENCOUNTER — Emergency Department (HOSPITAL_COMMUNITY): Payer: BC Managed Care – PPO

## 2011-03-04 DIAGNOSIS — R1013 Epigastric pain: Secondary | ICD-10-CM | POA: Insufficient documentation

## 2011-03-04 DIAGNOSIS — R079 Chest pain, unspecified: Secondary | ICD-10-CM | POA: Insufficient documentation

## 2011-03-04 DIAGNOSIS — R1110 Vomiting, unspecified: Secondary | ICD-10-CM | POA: Insufficient documentation

## 2011-03-04 DIAGNOSIS — R748 Abnormal levels of other serum enzymes: Secondary | ICD-10-CM | POA: Insufficient documentation

## 2011-03-04 LAB — CBC
HCT: 35.8 % — ABNORMAL LOW (ref 36.0–46.0)
MCH: 31.2 pg (ref 26.0–34.0)
MCV: 89.9 fL (ref 78.0–100.0)
Platelets: 265 10*3/uL (ref 150–400)
RBC: 3.98 MIL/uL (ref 3.87–5.11)
RDW: 11.5 % (ref 11.5–15.5)

## 2011-03-04 LAB — URINALYSIS, ROUTINE W REFLEX MICROSCOPIC
Bilirubin Urine: NEGATIVE
Ketones, ur: NEGATIVE mg/dL
Leukocytes, UA: NEGATIVE
Nitrite: NEGATIVE
Protein, ur: 30 mg/dL — AB
Urobilinogen, UA: 1 mg/dL (ref 0.0–1.0)
pH: 8 (ref 5.0–8.0)

## 2011-03-04 LAB — DIFFERENTIAL
Eosinophils Absolute: 0 10*3/uL (ref 0.0–0.7)
Eosinophils Relative: 1 % (ref 0–5)
Lymphs Abs: 1.5 10*3/uL (ref 0.7–4.0)
Monocytes Relative: 6 % (ref 3–12)

## 2011-03-04 LAB — COMPREHENSIVE METABOLIC PANEL
Albumin: 3.6 g/dL (ref 3.5–5.2)
BUN: 12 mg/dL (ref 6–23)
Chloride: 104 mEq/L (ref 96–112)
Creatinine, Ser: 0.73 mg/dL (ref 0.50–1.10)
GFR calc Af Amer: >60 mL/min (ref >60)
Glucose, Bld: 89 mg/dL (ref 70–99)
Total Bilirubin: 0.8 mg/dL (ref 0.3–1.2)

## 2011-03-04 LAB — URINE MICROSCOPIC-ADD ON

## 2011-03-04 LAB — LIPASE, BLOOD: Lipase: 65 U/L — ABNORMAL HIGH (ref 11–59)

## 2011-03-05 LAB — URINE CULTURE
Colony Count: 25000
Culture  Setup Time: 201208071434

## 2012-06-15 ENCOUNTER — Ambulatory Visit: Payer: BC Managed Care – PPO | Admitting: Family Medicine

## 2012-07-16 ENCOUNTER — Ambulatory Visit (INDEPENDENT_AMBULATORY_CARE_PROVIDER_SITE_OTHER): Payer: BC Managed Care – PPO | Admitting: Emergency Medicine

## 2012-07-16 ENCOUNTER — Encounter: Payer: Self-pay | Admitting: Emergency Medicine

## 2012-07-16 VITALS — BP 119/68 | HR 60 | Ht 62.5 in | Wt 184.0 lb

## 2012-07-16 DIAGNOSIS — Z0000 Encounter for general adult medical examination without abnormal findings: Secondary | ICD-10-CM

## 2012-07-16 DIAGNOSIS — R102 Pelvic and perineal pain: Secondary | ICD-10-CM | POA: Insufficient documentation

## 2012-07-16 NOTE — Assessment & Plan Note (Signed)
I think this is likely related to ovulation given worsening 2 weeks prior to menses.  Discussed options extensively with patient, including continued monitoring and pelvic ultrasound.  At this time, she would like to continue to monitor at home.  Follow up with Dr. Benjamin Stain in 1-2 months to reassess.

## 2012-07-16 NOTE — Progress Notes (Signed)
  Subjective:    Patient ID: Laurie Perry, female    DOB: [birth year], 46 y.o.   MRN: [id number]  HPI Laurie Perry is here for new patient appt and pelvic pain.  I have reviewed and updated the following as appropriate: allergies, current medications, past family history, past medical history, past social history, past surgical history and problem list PMHx: none  FHx: HTN in mother SHx: never smoker  Pelvic pain She reports suprapubic and lower quandrant pain.  This is intermittent, sometimes with sex, more often 2 weeks prior to period.  LMP 06/08/2012.  Periods are regular without dysmenorrhea or menorrhagia.  She does have known fibroids, although she reports that they were not seen during her c-section 17 months ago.  No vaginal d/c, irregular bleeding, or new partners.  She and her partner and trying to get pregnant.  She is seen by Dr. Cherly Hensen who checked a pap smear and STDs earlier this month that were all normal.  Review of Systems Patient Information Form: Screening and ROS Do you feel safe in relationships? yes PHQ-2:negative  Review of Symptoms  General:  Negative for nexplained weight loss, fever Skin: Negative for new or changing mole, sore that won't heal HEENT: Negative for trouble hearing, trouble seeing, ringing in ears, mouth sores, hoarseness, change in voice, dysphagia. CV:  Negative for chest pain, dyspnea, edema, palpitations Resp: Negative for cough, dyspnea, hemoptysis GI: Negative for nausea, vomiting, diarrhea, constipation, abdominal pain, melena, hematochezia. GU: Negative for dysuria, incontinence, urinary hesitance, hematuria, vaginal or penile discharge, polyuria, sexual difficulty, lumps in testicle or breasts MSK: Negative for muscle cramps or aches, joint pain or swelling Neuro: Negative for +headaches, weakness, numbness, dizziness, passing out/fainting Psych: Negative for depression, anxiety, memory problems      Objective:   Physical Exam BP  119/68  Pulse 60  Ht 5' 2.5" (1.588 m)  Wt 184 lb (83.462 kg)  BMI 33.12 kg/m2  LMP 06/20/2012 Gen: alert, cooperative, NAD HEENT: AT/[place], sclera white, PERRL, MMM, no pharyngeal erythema or exudate Neck: supple, no LAD CV: RRR, no murmurs Pulm: CTAB, no wheezes or rales Abd: obese, +BS, soft, ND, mild tenderness in suprapubic and RLQ Ext: no edema, 2+ DP pulses bilaterally GU: deferred as just check by Dr. Cherly Hensen      Assessment & Plan:

## 2012-07-16 NOTE — Patient Instructions (Addendum)
It was nice to meet you! Please keep track of the pelvic pain and it's relation to your menstrual cycle. You can get a kit help predict ovulation - like Clear Blue (monitors a hormone called LH). Or you can monitor your basal body temperature.  This website gives a lot of information about it.  [email or web address].babycentre.co.uk/  Follow up with Dr. Benjamin Stain in a few months.

## 2012-07-16 NOTE — Assessment & Plan Note (Signed)
Health care maintenance up to date.  Discussed ovulation and conception at length.

## 2012-07-30 ENCOUNTER — Ambulatory Visit (INDEPENDENT_AMBULATORY_CARE_PROVIDER_SITE_OTHER): Payer: BC Managed Care – PPO | Admitting: Family Medicine

## 2012-07-30 ENCOUNTER — Encounter: Payer: Self-pay | Admitting: Family Medicine

## 2012-07-30 VITALS — BP 154/95 | HR 88 | Temp 98.5°F | Ht 62.5 in | Wt 183.0 lb

## 2012-07-30 DIAGNOSIS — R51 Headache: Secondary | ICD-10-CM

## 2012-07-30 DIAGNOSIS — R0789 Other chest pain: Secondary | ICD-10-CM

## 2012-07-30 NOTE — Progress Notes (Signed)
Subjective:     Patient ID: Laurie Perry, female   DOB: Nov 05, 1965, 47 y.o.   MRN: [id number]  CC - Headache  HPI  Laurie Perry is a 47 y.o. female here for evaluation of headaches.  Patient states that she has had 3 years of on and off headaches that feels like pressure, occasionally unilateral on L or R side and occasionally along her forehead, which last for 1 week at a time, along with declinig vision x 4-5 years.  Uses tylenol or goody's powder, but headache does not go away quickly.  Pain is 8-9/10 and can wake pt from sleep.  Feels like headaches had improved some but returned over the past few weeks.  Pt has photophobia occasionally if headache wakes her from sleep or if she has not gotten enough sleep.  Denies hearing changes, dizziness, loss of consciousness, fevers, chills nausea, vomiting, sweating, rhinorrhea, eye discharge, exertional chest pain, slurred speech, abnormal gait, sudden weakness in arms/legs, h/o seizure.  Does report chest pain unrelated to HA that is irritating, achy, and tight, occurs when she lifts heavy things and is reproducible with she pushes on her chest.  Also reports that multiple times last year, she woke up and her lip was swollen and her bottom lift shifted to the right.  She is therefore worried about stroke.  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  She has been seeing an ophthalmologist and was unsatisfied because she felt she never got a good reason for her headache.  Last Friday went to a different ophthalmologist who did a thorough exam and thought her headaches were cluster v migraines but was also concerned about elevated BP.  However, BP in clinic 12/20 was WNL.    Review of Systems Per HPI; otherwise WNL.  Objective:   Physical Exam BP 154/95  Pulse 88  Temp 98.5 F (36.9 C) (Oral)  Ht 5' 2.5" (1.588 m)  Wt 183 lb (83.008 kg)  BMI 32.94 kg/m2  LMP 06/20/2012 BP on recheck 130s/80s GEN: sitting in  exam room, pleasant, NAD HEENT: bilateral sclera with small whitish growth, mild conjunctival erythema, no scleral icterus Right ear with no TM NEURO: CN 2-12 in tact, cerebellar exam, finger-nose-finger, UE and LE rapid-alternating movements WNL; no asterixis or pronator drift; normal speech and normal UE and LE strength CV: RRR, no m/r/g PULM: CTAB, normal effort, no wheezes or crackles SKIN: Palms slightly yellowed.  Chest: Right sided tenderness to palpation     Assessment:     Laurie Perry is a 47 y.o. 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    Plan:     # Health maintenance:  - AST/ALT check - last check in AUg 2012 elevated to 220 and 118, respectively - Inquire about vaccination status given that moved from different country recently - Recommended dental follow up - BP wnl on recheck - Consider checking lipids as no record of this being checked

## 2012-07-30 NOTE — Patient Instructions (Addendum)
It was good to see you today.  Please see me again in about 1 month, or sooner if possible or if necessary. - Because your nerve exam was normal and you've had headaches for so long, I don't think you need an MRI right now.  We can discuss this further at your follow up. - Your blood pressure on recheck was normal. - I would not recommend continuing goody's, but you can take naproxen (take with meals, and stop if you start having stomach irritation) for your headaches. - Keep a headache diary to help [place] understand if there is a trigger.

## 2012-07-31 DIAGNOSIS — R0789 Other chest pain: Secondary | ICD-10-CM | POA: Insufficient documentation

## 2012-07-31 NOTE — Assessment & Plan Note (Signed)
Patient with nonexertional chest pain with lifting that was tender on exam - Recommend rest and nsaids

## 2012-07-31 NOTE — Assessment & Plan Note (Addendum)
DDx includes headache  (cluster v migraine v tension v some combination), pseudotumor cerebri which is most common in females of this age group (though less likely because lacks common clinical findings of DAILY headaches, diplopia, +/- papilledema and 6th CN palsy (visual field deficit)), TIA/stroke, subarachnoid hemorrhage, intracranial hemorrhage, mass effect.  Given chronicity and no focal findings on exam, headache is most likely. - Reassured that no focal findings on exam. - For pain, recommended NSAID and tylenol and recommended stopping Goody's with potential for increased gastritis side effect - Recommended headache diary to understand if any triggers exist - F/u in 1 month or sooner if possible to f/u on Headaches; consider MRI if concerning for focal deficit or mass effect or to evaluate for pseudotumor ceribri; recheck vision and opthalmologic exam - Instructed on reasons to return or seek emergency care

## 2012-09-03 ENCOUNTER — Ambulatory Visit (INDEPENDENT_AMBULATORY_CARE_PROVIDER_SITE_OTHER): Payer: BC Managed Care – PPO | Admitting: Emergency Medicine

## 2012-09-03 ENCOUNTER — Encounter: Payer: Self-pay | Admitting: Emergency Medicine

## 2012-09-03 VITALS — BP 117/75 | HR 87 | Ht 62.5 in | Wt 185.0 lb

## 2012-09-03 DIAGNOSIS — R51 Headache: Secondary | ICD-10-CM

## 2012-09-03 NOTE — Patient Instructions (Addendum)
It was nice to see you! I am going to put in a referral for you to see the [facility] here in [place].  Their number is [phone]; please give them a call on Monday to see I need to do anything else.  In the meantime, use tylenol or ibuprofen as needed for the headaches.  If you take ibuprofen, take it with food.  Http://americanpregnancy.org/gettingpregnant/ovulationkits.html You can get many ovulation kits, such as First Response or Clear Blue Easy, at any pharmacy.  Follow up with your doctor after you see the [facility] or sooner as needed.

## 2012-09-03 NOTE — Assessment & Plan Note (Addendum)
No headache today.  Report of lip drooping with headache mildly concerning, but resolves spontaneously within 30 minutes and does not occur with every headache.  No other neurologic findings.  Neuro exam is normal today.  No clear indication for imaging.  Will refer to the [facility].  Provided patient with their number as well.  Continue tylenol or ibuprofen/naprosyn for acute headache relief.   Follow up with PCP after seeing the headache center.

## 2012-09-03 NOTE — Progress Notes (Signed)
  Subjective:    Patient ID: Laurie Perry, female    DOB: [birth year], 47 y.o.   MRN: [id number]  HPI Quynh Basso is here for f/u of headaches.  She reports no headaches for the last 3 weeks.  States that she typically has headaches for 1 week, then several weeks with no headaches and this cycle repeats.  The headaches have been present for at least 3-4 years.  They are relieved by tylenol.  Located sometimes unilateral, sometimes frontal, sometimes bilateral.  Occasional photophobia.  Occasional lip drooping with the headache that resolved in 30 minutes.  No eye tearing.  Can wake her from sleep.  No weakness or numbness in her extremities, no worsening in supine position, no fainting or dizziness.  She has not identified any triggers (denies menses, stress and caffeine); thinks chocolate may be trigger but she is not sure.  I have reviewed and updated the following as appropriate: allergies and current medications SHx: never smoker  Review of Systems See HPI    Objective:   Physical Exam BP 117/75  Pulse 87  Ht 5' 2.5" (1.588 m)  Wt 185 lb (83.915 kg)  BMI 33.30 kg/m2  LMP 08/15/2012 Gen: alert, cooperative, NAD Neuro: CN II-XII intact bilaterally; strength 5/5 in all extremities, patellar and biceps reflexes 2+ and symmetric, rapid alternating movements normal, finger-nose-finger test normal, sensation intact to light touch throughout     Assessment & Plan:

## 2012-09-06 ENCOUNTER — Other Ambulatory Visit: Payer: Self-pay | Admitting: Specialist

## 2012-09-12 ENCOUNTER — Ambulatory Visit
Admission: RE | Admit: 2012-09-12 | Discharge: 2012-09-12 | Disposition: A | Discharge status: Home / Self Care | Payer: BC Managed Care – PPO | Source: Ambulatory Visit | Attending: Specialist | Admitting: Specialist

## 2013-02-15 ENCOUNTER — Encounter: Payer: Self-pay | Admitting: Neurology

## 2013-02-25 ENCOUNTER — Encounter: Payer: Self-pay | Admitting: Neurology

## 2013-02-25 ENCOUNTER — Ambulatory Visit (INDEPENDENT_AMBULATORY_CARE_PROVIDER_SITE_OTHER): Payer: BC Managed Care – PPO | Admitting: Neurology

## 2013-02-25 VITALS — BP 121/72 | HR 87 | Resp 16 | Ht 63.0 in | Wt 182.0 lb

## 2013-02-25 DIAGNOSIS — G4730 Sleep apnea, unspecified: Secondary | ICD-10-CM

## 2013-02-25 DIAGNOSIS — G4733 Obstructive sleep apnea (adult) (pediatric): Secondary | ICD-10-CM

## 2013-02-25 DIAGNOSIS — E669 Obesity, unspecified: Secondary | ICD-10-CM | POA: Insufficient documentation

## 2013-02-25 NOTE — Progress Notes (Signed)
[facility]  Provider:  Melvyn Novas, M D  Referring Provider: Leona Singleton, * Primary Care Physician:  Simone Curia, MD  Chief Complaint  Patient presents with  . Sleep - CPAP    # 11 Revisit    HPI:  Laurie Perry is a 47 y.o. female  Is seen here as a referral/ revisit  from Dr. Benjamin Stain for followup on sleep apnea.  Laurie Perry was first referred to [place] by Dr. Elgie Congo  in November  2012,  at the time she did not feel excessively daytime sleepy but her husband had told her that she was  snoring and sometimes not breathing regularly at night .  She endorsed vivid dreams and nocturia. She was working full time and caring for 2 young children and attributed her  Fatigue to that.  A polysomnography was obtained in November 2012 which documented an overall AHI of 22.6 and a REM AHI of 52.9 her supine AHI was 48.4.  Her history was positive for allergic bronchitis, some sinusitis,  baseline tachypnea of unknown origin, and asthma.  Later she also developed migrainous headaches . The  Epworth score was only  3 out of 24 points and the Beck Depression Inventory at one point.  At the time of her PSG , her body mass index was 32.4 and her neck circumference 15 inches she was not on medication at the time of her sleep study.  The patient returned to be titrated to CPAP 10 cm water was recommended and the AHI at the setting was 1.5 per hour she had no PLMS , a normal sleep efficiency,  normal sleep architecture and normal EKG.  Today's Epworth sleepiness score is actually filled out at 7 points so higher than her original. nocturia and nightmares have resolved with CPAP.    CPAP download was obtained and 2012, 2013 no and July 2014 it shows a residual AHI of 0.3. Another download obtained in the office today documents an AHI of 0.4 average daily used to time 5 hours 44 minutes the patient has been compliance since the initiation of CPAP therapy. She is  followed by respiratory or in 2013 the patient was given a new mask different from the original use the sleep study which has provided more comfortable his air leaks.  The patient was to bed between 10 and 11 PM and arises in the morning at about 7 AM. She estimates her on file nocturnal sleep time at 7 hours or more. She still has to go to the bathroom once at night. The patient does not use any caffeine in daytime she does not nap in daytime. She has no shift work in his is to read and has had these regular sleep habits for many years.  She is physically active at work. She works as a Counselling psychologist.  She was recently diagnosed with migraine headaches which have alleviated  by Topiramate and she is [facility] .         Review of Systems: Out of a complete 14 system review, the patient complains of only the following symptoms, and all other reviewed systems are negative.   History   Social History  . Marital Status: Married    Spouse Name: Kodjo    Number of Children: 1  . Years of Education: college   Occupational History  .      [employer]   Social History Main Topics  .  Smoking status: Never Smoker   . Smokeless tobacco: Never Used  . Alcohol Use: 0.5 oz/week    1 drink(s) per week     Comment: occasional with dinner  . Drug Use: No  . Sexually Active: Yes    Birth Control/ Protection: None   Other Topics Concern  . Not on file   Social History Narrative   Patient lives at home with her husband Wyatt Portela) and her mother and son.Patient has a college education. Patient works at [employer].   Right handed.   Caffeine- None.    Family History  Problem Relation Age of Onset  . Hypertension Mother   . Arthritis Mother   . High blood pressure Father     Past Medical History  Diagnosis Date  . GERD (gastroesophageal reflux disease)   . Uterine fibroids in pregnancy, postpartum condition   . Ovarian cyst   . OSA (obstructive sleep  apnea)     severe  . Asthma     allergic  . Bronchitis   . Obesity     Past Surgical History  Procedure Laterality Date  . Cesarean section      2012    Current Outpatient Prescriptions  Medication Sig Dispense Refill  . baclofen (LIORESAL) 10 MG tablet Take 10 mg by mouth as needed.      Marland Kitchen ibuprofen (ADVIL,MOTRIN) 800 MG tablet Take 800 mg by mouth as needed.      . Multiple Vitamin (MULTIVITAMIN) capsule Take 1 capsule by mouth daily.      Marland Kitchen topiramate (TOPAMAX) 25 MG tablet Take 25 mg by mouth 3 (three) times daily.       No current facility-administered medications for this visit.    Allergies as of 02/25/2013 - Review Complete 02/25/2013  Allergen Reaction Noted  . Other  07/16/2012    Vitals: BP 121/72  Pulse 87  Resp 16  Ht 5\' 3"  (1.6 m)  Wt 182 lb (82.555 kg)  BMI 32.25 kg/m2 Last Weight:  Wt Readings from Last 1 Encounters:  02/25/13 182 lb (82.555 kg)   Last Height:   Ht Readings from Last 1 Encounters:  02/25/13 5\' 3"  (1.6 m)    Physical exam:  General: The patient is awake, alert and appears not in acute distress. The patient is well groomed. Head: Normocephalic, atraumatic. Neck is supple. Mallampati 2  neck circumference: 14 ,  No retrognathia , no nasal deviation,  Cardiovascular:  Regular rate and rhythm without  murmurs or carotid bruit, and without distended neck veins. Respiratory: Lungs are clear to auscultation. Skin:  Without evidence of edema, or rash Trunk: BMI is elevated and patient  has normal posture.  Neurologic exam : The patient is awake and alert, oriented to place and time.  Memory subjective  described as intact. There is a normal attention span & concentration ability. Speech is fluent with dysarthria, dysphonia . Mood and affect are appropriate.  Cranial nerves: Pupils are equal and briskly reactive to light. The patient has cloudy, possibly neo-vascularized areas at the rim of both irises. Medial lower edge of the outside  iris,   Funduscopic exam without  evidence of pallor or edema. Extraocular movements  in vertical and horizontal planes intact and without nystagmus. Visual fields by finger perimetry are intact. Hearing to finger rub intact.  Facial sensation intact to fine touch. Facial motor strength is symmetric and tongue and uvula move midline.  Motor exam:   Normal tone and normal muscle bulk and symmetric  normal strength in all extremities.  Sensory:  Fine touch, pinprick and vibration were tested in all extremities. Proprioception isnormal.  Coordination: Rapid alternating movements in the fingers/hands is tested and normal. .  Gait and station: Patient walks without assistive device, testing is normal.  Deep tendon reflexes: in the  upper and lower extremities are symmetric and intact.    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 She has a high grade mallomati, large tongue and retrognathia.   Plan:  Treatment plan and additional workup : continue CPAP - documented 100% compliance for her  Long term care insurance , she wants to obtain a police .   2 days out of  5 diet explained, fasting intermittently . ( Dr. Leland Johns ) . Continue exercise ,  Continue CPAP use.  Avoid supine position.

## 2013-02-25 NOTE — Patient Instructions (Signed)
CPAP and BIPAP CPAP and BIPAP are methods of helping you breathe. CPAP stands for "continuous positive airway pressure." BIPAP stands for "bi-level positive airway pressure." Both CPAP and BIPAP are provided by a small machine with a flexible plastic tube that attaches to a plastic mask that goes over your nose or mouth. Air is blown into your air passages through your nose or mouth. This helps to keep your airways open and helps to keep you breathing well. The amount of pressure that is used to blow the air into your air passages can be set on the machine. The pressure setting is based on your needs. With CPAP, the amount of pressure stays the same while you breathe in and out. With BIPAP, the amount of pressure changes when you inhale and exhale. Your caregiver will recommend whether CPAP or BIPAP would be more helpful for you.  CPAP and BIPAP can be helpful for both adults and children with:  Sleep apnea.  Chronic Obstructive Pulmonary Disease (COPD), a condition like emphysema.  Diseases which weaken the muscles of the chest such as muscular dystrophy or neurological diseases.  Other problems that cause breathing to be weak or difficult. USE OF CPAP OR BIPAP The respiratory therapist or technician will help you get used to wearing the mask. Some people feel claustrophobic (a trapped or closed in feeling) at first, because the mask needs to be fairly snug on your face.   It may help you to get used to the mask gradually, by first holding the mask loosely over your nose or mouth using a low pressure setting on the machine. Gradually the mask can be applied more snugly with increased pressure. You can also gradually increase the amount of time the mask is used.  People with sleep apnea will use the mask and machine at night when they are sleeping. Others, like those with ALS or other breathing difficulties, may need the CPAP or BIPAP all the time.  If the first mask you try does not fit well, or  is uncomfortable, there are other types and sizes that can be tried.  If you tend to breathe through your mouth, a chin strap may be applied to help keep your mouth closed (if you are using a nasal mask).  The CPAP and BIPAP machines have alarms that may sound if the mask comes off or develops a leak.  You should not eat or drink while the CPAP or BIPAP is on. Food or fluids could get pushed into your lungs by the pressure of the CPAP or BIPAP. Sometimes CPAP or BIPAP machines are ordered for home use. If you are going to use the CPAP or BIPAP machine at home, follow these instructions  CPAP or BIPAP machines can be rented or purchased through home health care companies. There are many different brands of machines available. If you rent a machine before purchasing you may find which particular machine works well for you.  Ask questions if there is something you do not understand when picking out your machine.  Place your CPAP or BIPAP machine on a secure table or stand near an electrical outlet.  Know where the On/Off switch is.  Follow your doctor's instructions for how to set the pressure on your machine and when you should use it.  Do not smoke! Tobacco smoke residue can damage the machine. SEEK IMMEDIATE MEDICAL CARE IF:   You have redness or open areas around your nose or mouth.  You have trouble operating  the CPAP or BIPAP machine.  You cannot tolerate wearing the CPAP or BIPAP mask.  You have any questions or concerns. Document Released: 04/11/2004 Document Revised: 10/06/2011 Document Reviewed: 07/11/2008 [facility] Patient Information 2014 [place], [place]. Exercise to Lose Weight Exercise and a healthy diet may help you lose weight. Your doctor may suggest specific exercises. EXERCISE IDEAS AND TIPS  Choose low-cost things you enjoy doing, such as walking, bicycling, or exercising to workout videos.  Take stairs instead of the elevator.  Walk during your lunch  break.  Park your car further away from work or school.  Go to a gym or an exercise class.  Start with 5 to 10 minutes of exercise each day. Build up to 30 minutes of exercise 4 to 6 days a week.  Wear shoes with good support and comfortable clothes.  Stretch before and after working out.  Work out until you breathe harder and your heart beats faster.  Drink extra water when you exercise.  Do not do so much that you hurt yourself, feel dizzy, or get very short of breath. Exercises that burn about 150 calories:  Running 1  miles in 15 minutes.  Playing volleyball for 45 to 60 minutes.  Washing and waxing a car for 45 to 60 minutes.  Playing touch football for 45 minutes.  Walking 1  miles in 35 minutes.  Pushing a stroller 1  miles in 30 minutes.  Playing basketball for 30 minutes.  Raking leaves for 30 minutes.  Bicycling 5 miles in 30 minutes.  Walking 2 miles in 30 minutes.  Dancing for 30 minutes.  Shoveling snow for 15 minutes.  Swimming laps for 20 minutes.  Walking up stairs for 15 minutes.  Bicycling 4 miles in 15 minutes.  Gardening for 30 to 45 minutes.  Jumping rope for 15 minutes.  Washing windows or floors for 45 to 60 minutes. Document Released: 08/16/2010 Document Revised: 10/06/2011 Document Reviewed: 08/16/2010 [facility] Patient Information 2014 [place], [place]. Sleep Apnea Sleep apnea is disorder that affects a person's sleep. A person with sleep apnea has abnormal pauses in their breathing when they sleep. It is hard for them to get a good sleep. This makes a person tired during the day. It also can lead to other physical problems. There are three types of sleep apnea. One type is when breathing stops for a short time because your airway is blocked (obstructive sleep apnea). Another type is when the brain sometimes fails to give the normal signal to breathe to the muscles that control your breathing (central sleep apnea). The third type is  a combination of the other two types. HOME CARE  Do not sleep on your back. Try to sleep on your side.  Take all medicine as told by your doctor.  Avoid alcohol, calming medicines (sedatives), and depressant drugs.  Try to lose weight if you are overweight. Talk to your doctor about a healthy weight goal. Your doctor may have you use a device that helps to open your airway. It can help you get the air that you need. It is called a positive airway pressure (PAP) device. There are three types of PAP devices:  Continuous positive airway pressure (CPAP) device.  Nasal expiratory positive airway pressure (EPAP) device.  Bilevel positive airway pressure (BPAP) device. MAKE SURE YOU:  Understand these instructions.  Will watch your condition.  Will get help right away if you are not doing well or get worse. Document Released: 04/22/2008 Document Revised: 06/30/2012 Document  Reviewed: 11/15/2011 [employer] Patient Information 2014 [place], [place].

## 2013-03-09 ENCOUNTER — Encounter: Payer: Self-pay | Admitting: Neurology

## 2013-04-10 ENCOUNTER — Encounter (HOSPITAL_COMMUNITY): Payer: Self-pay | Admitting: *Deleted

## 2013-04-10 ENCOUNTER — Emergency Department (INDEPENDENT_AMBULATORY_CARE_PROVIDER_SITE_OTHER)
Admission: EM | Admit: 2013-04-10 | Discharge: 2013-04-10 | Disposition: A | Discharge status: Home / Self Care | Payer: BC Managed Care – PPO | Source: Home / Self Care | Attending: Family Medicine | Admitting: Family Medicine

## 2013-04-10 DIAGNOSIS — T6391XA Toxic effect of contact with unspecified venomous animal, accidental (unintentional), initial encounter: Secondary | ICD-10-CM

## 2013-04-10 DIAGNOSIS — T63461A Toxic effect of venom of wasps, accidental (unintentional), initial encounter: Secondary | ICD-10-CM

## 2013-04-10 HISTORY — DX: Migraine, unspecified, not intractable, without status migrainosus: G43.909

## 2013-04-10 MED ORDER — EPINEPHRINE 0.3 MG/0.3ML IJ SOAJ: 0.3000 mg | Freq: Once | INTRAMUSCULAR | Status: DC

## 2013-04-10 MED ORDER — DOXYCYCLINE HYCLATE 100 MG PO CAPS: 100.0000 mg | ORAL_CAPSULE | Freq: Two times a day (BID) | ORAL | Status: DC

## 2013-04-10 MED ORDER — PREDNISONE 10 MG PO KIT: PACK | ORAL | Status: DC

## 2013-04-10 NOTE — ED Notes (Signed)
Reports being stung by ?bee yesterday on right thigh.  States she saw it fly in her dress.  Describes quickly feeling burning and itching extending up into her face/ears (which has now resolved).  Now c/o significant redness, swelling to entire right thigh.  Large amount swelling noted.  Has taken OTC pain reliever.

## 2013-04-10 NOTE — ED Provider Notes (Signed)
Laurie Perry is a 47 y.o. female who presents to Urgent Care today for bee sting right thigh. Patient was stung by a yellow jacket on her right anterior thigh yesterday. She had immediate full body itching which resolved after about an hour. She notes worsening right leg swelling at the site of the bee sting. Mildly painful as well as itchy. She denies any fevers or chills trouble breathing nausea vomiting or diarrhea. She has tried some Benadryl which has helped a bit; she's also tried some over-the-counter pain medications which have helped some as well.    Past Medical History  Diagnosis Date  . GERD (gastroesophageal reflux disease)   . Uterine fibroids in pregnancy, postpartum condition   . Ovarian cyst   . OSA (obstructive sleep apnea)     severe  . Asthma     allergic  . Bronchitis   . Obesity   . Migraines    History  Substance Use Topics  . Smoking status: Never Smoker   . Smokeless tobacco: Never Used  . Alcohol Use: 0.5 oz/week    1 drink(s) per week     Comment: occasional   ROS as above Medications reviewed. No current facility-administered medications for this encounter.   Current Outpatient Prescriptions  Medication Sig Dispense Refill  . Multiple Vitamin (MULTIVITAMIN) capsule Take 1 capsule by mouth daily.      Marland Kitchen topiramate (TOPAMAX) 25 MG tablet Take 25 mg by mouth 3 (three) times daily.      . baclofen (LIORESAL) 10 MG tablet Take 10 mg by mouth as needed.      . doxycycline (VIBRAMYCIN) 100 MG capsule Take 1 capsule (100 mg total) by mouth 2 (two) times daily.  20 capsule  0  . EPINEPHrine (EPIPEN) 0.3 mg/0.3 mL SOAJ injection Inject 0.3 mLs (0.3 mg total) into the muscle once. With bee sting  2 Device  0  . ibuprofen (ADVIL,MOTRIN) 800 MG tablet Take 800 mg by mouth as needed.      . PredniSONE 10 MG KIT 12 day dose pack po  1 kit  0    Exam:  BP 146/75  Pulse 84  Temp(Src) 98 F (36.7 C) (Oral)  Resp 16  SpO2 100%  LMP 03/25/2013 Gen: Well  NAD HEENT: EOMI,  MMM Lungs: CTABL Nl WOB Heart: RRR no MRG Exts:  anterior right thigh large area of swelling induration and tenderness.  Capillary refill sensation and pulses are intact distally.  Normal gait .   No results found for this or any previous visit (from the past 24 hour(s)). No results found.  Assessment and Plan: 47 y.o. female with bee sting with large local reaction. Patient may also have cellulitis.  Plan to treat both with doxycycline and prednisone as well as Benadryl.  Her initial full body itching following the bee sting is concerning for severe bee sting allergy. She may develop anaphylaxis with the next bee sting.  Epinephrine pen was prescribed.  Followup with primary care provider Discussed warning signs or symptoms. Please see discharge instructions. Patient expresses understanding.      Rodolph Bong, MD 04/10/13 [phone]

## 2013-05-07 ENCOUNTER — Emergency Department (HOSPITAL_COMMUNITY): Payer: BC Managed Care – PPO

## 2013-05-07 ENCOUNTER — Encounter (HOSPITAL_COMMUNITY): Payer: Self-pay | Admitting: Emergency Medicine

## 2013-05-07 ENCOUNTER — Emergency Department (HOSPITAL_COMMUNITY)
Admission: EM | Admit: 2013-05-07 | Discharge: 2013-05-07 | Disposition: A | Discharge status: Home / Self Care | Payer: BC Managed Care – PPO | Attending: Emergency Medicine | Admitting: Emergency Medicine

## 2013-05-07 ENCOUNTER — Emergency Department (HOSPITAL_COMMUNITY)
Admission: EM | Admit: 2013-05-07 | Discharge: 2013-05-07 | Disposition: A | Discharge status: Home / Self Care | Payer: BC Managed Care – PPO | Source: Home / Self Care | Attending: Emergency Medicine | Admitting: Emergency Medicine

## 2013-05-07 DIAGNOSIS — Z8719 Personal history of other diseases of the digestive system: Secondary | ICD-10-CM | POA: Insufficient documentation

## 2013-05-07 DIAGNOSIS — Z8742 Personal history of other diseases of the female genital tract: Secondary | ICD-10-CM | POA: Insufficient documentation

## 2013-05-07 DIAGNOSIS — R51 Headache: Secondary | ICD-10-CM

## 2013-05-07 DIAGNOSIS — G43909 Migraine, unspecified, not intractable, without status migrainosus: Secondary | ICD-10-CM | POA: Insufficient documentation

## 2013-05-07 DIAGNOSIS — E669 Obesity, unspecified: Secondary | ICD-10-CM | POA: Insufficient documentation

## 2013-05-07 DIAGNOSIS — Z8709 Personal history of other diseases of the respiratory system: Secondary | ICD-10-CM | POA: Insufficient documentation

## 2013-05-07 DIAGNOSIS — Z8669 Personal history of other diseases of the nervous system and sense organs: Secondary | ICD-10-CM | POA: Insufficient documentation

## 2013-05-07 LAB — CBC WITH DIFFERENTIAL/PLATELET
Basophils Relative: 0 % (ref 0–1)
HCT: 33.3 % — ABNORMAL LOW (ref 36.0–46.0)
Hemoglobin: 11.3 g/dL — ABNORMAL LOW (ref 12.0–15.0)
Lymphocytes Relative: 48 % — ABNORMAL HIGH (ref 12–46)
Monocytes Absolute: 0.4 10*3/uL (ref 0.1–1.0)
Monocytes Relative: 7 % (ref 3–12)
Neutro Abs: 2.4 10*3/uL (ref 1.7–7.7)
Neutrophils Relative %: 44 % (ref 43–77)
RBC: 3.6 MIL/uL — ABNORMAL LOW (ref 3.87–5.11)
WBC: 5.6 10*3/uL (ref 4.0–10.5)

## 2013-05-07 LAB — BASIC METABOLIC PANEL
BUN: 9 mg/dL (ref 6–23)
CO2: 25 mEq/L (ref 19–32)
Chloride: 102 mEq/L (ref 96–112)
Creatinine, Ser: 0.86 mg/dL (ref 0.50–1.10)
GFR calc Af Amer: >90 mL/min (ref >90)
Potassium: 3.7 mEq/L (ref 3.5–5.1)

## 2013-05-07 LAB — URINALYSIS, ROUTINE W REFLEX MICROSCOPIC
Glucose, UA: NEGATIVE mg/dL
Ketones, ur: NEGATIVE mg/dL
Leukocytes, UA: NEGATIVE
Nitrite: NEGATIVE
Specific Gravity, Urine: 1.017 (ref 1.005–1.030)
pH: 7 (ref 5.0–8.0)

## 2013-05-07 MED ORDER — TRAMADOL HCL 50 MG PO TABS: 50.0000 mg | ORAL_TABLET | Freq: Four times a day (QID) | ORAL | Status: DC | PRN

## 2013-05-07 MED ORDER — SODIUM CHLORIDE 0.9 % IV SOLN
INTRAVENOUS | Status: DC
  Administered 2013-05-07: 1000 mL via INTRAVENOUS

## 2013-05-07 MED ORDER — PROCHLORPERAZINE EDISYLATE 5 MG/ML IJ SOLN
10.0000 mg | Freq: Once | INTRAMUSCULAR | Status: AC
  Administered 2013-05-07: 10 mg via INTRAVENOUS
  Filled 2013-05-07: qty 2

## 2013-05-07 NOTE — ED Notes (Signed)
Pt reports developing occipital head pain x 3 days. States that she has a history of migraines but this one feels different. Went to [facility]. UCC recommended Ct of brain to r/o SAH. Pt denies nausea, vomiting, or fever. Does complain of a stiff neck. Able to turn neck without difficulty. Pt neuro intact.

## 2013-05-07 NOTE — ED Provider Notes (Signed)
CSN: [id number]     Arrival date & time 05/07/13  1832 History   First MD Initiated Contact with Patient 05/07/13 1905     Chief Complaint  Patient presents with  . Headache   (Consider location/radiation/quality/duration/timing/severity/associated sxs/prior Treatment) HPI Comments: Laurie Perry is a 47 y.o. Female who developed a headache 3 days ago. Initially it was intermittent. This morning. It began to be persistent, and worsened. At worse today. It was 8/10. Currently it is 6/10. She took some Motrin today with improvement of the pain. She has pain in her posterior neck and posterior head. The pain in the neck is worse with palpation. She denies stress or trauma. She is concerned about the situation in [place], because she is from [place]. She has not had any recent travel to [place]. She denies fever, chills, nausea, vomiting, weakness, dizziness, abdominal pain , or paresthesia. She's been taking her blood pressure home using her mother's monitor and found it to be somewhat elevated around 150/85. There are no other known modifying factors   Patient is a 47 y.o. female presenting with headaches. The history is provided by the patient.  Headache   Past Medical History  Diagnosis Date  . GERD (gastroesophageal reflux disease)   . Uterine fibroids in pregnancy, postpartum condition   . Ovarian cyst   . OSA (obstructive sleep apnea)     severe  . Bronchitis   . Obesity   . Migraines    Past Surgical History  Procedure Laterality Date  . Cesarean section      2012   Family History  Problem Relation Age of Onset  . Hypertension Mother   . Arthritis Mother   . High blood pressure Father    History  Substance Use Topics  . Smoking status: Never Smoker   . Smokeless tobacco: Never Used  . Alcohol Use: 0.5 oz/week    1 drink(s) per week     Comment: occasional   OB History   Grav Para Term Preterm Abortions TAB SAB Ect Mult Living   2 1  1      1       Review of Systems  Neurological: Positive for headaches.  All other systems reviewed and are negative.    Allergies  Other  Home Medications   Current Outpatient Rx  Name  Route  Sig  Dispense  Refill  . baclofen (LIORESAL) 10 MG tablet   Oral   Take 10 mg by mouth daily as needed (migraine).          Marland Kitchen EPINEPHrine (EPIPEN) 0.3 mg/0.3 mL SOAJ injection   Intramuscular   Inject 0.3 mg into the muscle daily as needed (bee stings).         Marland Kitchen ibuprofen (ADVIL,MOTRIN) 800 MG tablet   Oral   Take 800 mg by mouth every 8 (eight) hours as needed for pain.          . Multiple Vitamin (MULTIVITAMIN WITH MINERALS) TABS tablet   Oral   Take 1 tablet by mouth daily.         Marland Kitchen topiramate (TOPAMAX) 25 MG tablet   Oral   Take 25 mg by mouth 3 (three) times daily.         . traMADol (ULTRAM) 50 MG tablet   Oral   Take 1 tablet (50 mg total) by mouth every 6 (six) hours as needed for pain.   20 tablet   0    BP 139/70  Pulse 71  Temp(Src) 98.6 F (37 C) (Oral)  Resp 20  Ht 5' 2.5" (1.588 m)  Wt 186 lb (84.369 kg)  BMI 33.46 kg/m2  SpO2 94%  LMP 04/23/2013 Physical Exam  Nursing note and vitals reviewed. Constitutional: She is oriented to person, place, and time. She appears well-developed and well-nourished.  HENT:  Head: Normocephalic and atraumatic.  Eyes: Conjunctivae and EOM are normal. Pupils are equal, round, and reactive to light.  Neck: Normal range of motion and phonation normal. Neck supple.  No meningismus  Cardiovascular: Normal rate, regular rhythm and intact distal pulses.   Pulmonary/Chest: Effort normal and breath sounds normal. She exhibits no tenderness.  Abdominal: Soft. She exhibits no distension. There is no tenderness. There is no guarding.  Musculoskeletal: Normal range of motion.  Mild diffuse tenderness of the posterior neck to light touch.  Neurological: She is alert and oriented to person, place, and time. She exhibits normal  muscle tone.  Skin: Skin is warm and dry.  Psychiatric: She has a normal mood and affect. Her behavior is normal. Judgment and thought content normal.    ED Course  Procedures (including critical care time) Medications  0.9 %  sodium chloride infusion (1,000 mLs Intravenous New Bag/Given 05/07/13 2030)  prochlorperazine (COMPAZINE) injection 10 mg (10 mg Intravenous Given 05/07/13 2029)    Patient Vitals for the past 24 hrs:  BP Temp Temp src Pulse Resp SpO2 Height Weight  05/07/13 2117 139/70 mmHg - - 71 20 94 % - -  05/07/13 1921 140/87 mmHg 98.6 F (37 C) Oral 75 16 99 % - -  05/07/13 1838 - - - - - - 5' 2.5" (1.588 m) 186 lb (84.369 kg)  05/07/13 1837 158/104 mmHg 98.4 F (36.9 C) Oral 87 16 98 % 5' 2.5" (1.588 m) 186 lb (84.369 kg)   10:06 PM Reevaluation with update and discussion. After initial assessment and treatment, an updated evaluation reveals Pain better now, "1/10". Findings discussed with pt and family, questions answered.Mancel Bale L    Labs Review Labs Reviewed  CBC WITH DIFFERENTIAL - Abnormal; Notable for the following:    RBC 3.60 (*)    Hemoglobin 11.3 (*)    HCT 33.3 (*)    Lymphocytes Relative 48 (*)    All other components within normal limits  BASIC METABOLIC PANEL - Abnormal; Notable for the following:    GFR calc non Af Amer 80 (*)    All other components within normal limits  URINALYSIS, ROUTINE W REFLEX MICROSCOPIC   Imaging Review Dg Chest 2 View  05/07/2013   CLINICAL DATA:  Head and upper neck pain. No reported chest symptoms.  EXAM: CHEST  2 VIEW  COMPARISON:  02/16/2008 at [facility].  FINDINGS: Interval borderline enlargement of the cardiac silhouette and mild diffuse peribronchial thickening. Clear lungs. Unremarkable bones.  IMPRESSION: Interval borderline cardiomegaly and mild chronic bronchitic changes.   Electronically Signed   By: Gordan Payment M.D.   On: 05/07/2013 20:19   Ct Head Wo Contrast  05/07/2013   CLINICAL  DATA:  Posterior headache for the past 3 days.  EXAM: CT HEAD WITHOUT CONTRAST  TECHNIQUE: Contiguous axial images were obtained from the base of the skull through the vertex without intravenous contrast.  COMPARISON:  Brain MR dated 09/12/2012.  FINDINGS: Normal appearing cerebral hemispheres and posterior fossa structures. Normal size and position of the ventricles. No intracranial hemorrhage, mass lesion or CT evidence of acute infarction.  IMPRESSION: Normal examination.  Electronically Signed   By: Gordan Payment M.D.   On: 05/07/2013 20:10    EKG Interpretation   None       MDM   1. Headache       Nonspecific headache, which reassuring evaluation in the emergency department. Mild hypertension on arrival has improved spontaneously. Doubt hypertensive urgency, subarachnoid hemorrhage, meningitis, metabolic instability, or occult infection. This headache is different from her usual migraines. Pain improved in ED.   Plan: Home Medications- Tramadol; Home Treatments and Observation- Rest; return here if the recommended treatment, does not improve the symptoms; Recommended follow up- PCP prn    Flint Melter, MD 05/07/13 2211

## 2013-05-07 NOTE — ED Notes (Signed)
C/o back of head pain.  Patient states back of head has been hurting for three days now.  No medication taken but motrin

## 2013-05-07 NOTE — ED Notes (Signed)
Pt reports intermittent pain to back of head x 3 days. Pt has tried motrin without relief. Pt denies fever. Pt with elevated BP reports only had high BP during her pregnancy.

## 2013-05-07 NOTE — ED Provider Notes (Signed)
Chief Complaint:   Chief Complaint  Patient presents with  . Headache    History of Present Illness:   Laurie Perry is a 47 year old female who is three-day history of intermittent, throbbing, occipital headache. The onset was gradual. The pain was 9/10 at worst and now is a 5/10. She's had no associated nausea, vomiting, or photophobia. She does note some photophobia. The patient has stiffness of the neck and a heavy feeling in her neck. She feels tired and rundown. The patient states that this is the worst headache of her life. She has a history of migraine headaches in the past. She states this headache is different from her usual migraine headaches and that they usually frontal or bitemporal. She has [facility] and had an MRI scan last March which was negative. She was placed on Topamax which is not taking it right now. She denies any fever, chills, nasal congestion, rhinorrhea, diplopia, or blurry vision. She denies paresthesias, numbness, tingling, weakness, difficulty with speech, swallowing, ambulation, coordination, balance, or equilibrium.  She's also concerned about her blood pressure. She checked her blood pressure several times today and it was found to be 144-159 systolic over 74-87 diastolic. She does not have high blood pressure right now although she did have an elevated blood pressure during her pregnancy. She also has sleep apnea. She denies any anxiety, depression, or stress. She has had no foreign travel within the past 30 days.  Review of Systems:  Other than noted above, the patient denies any of the following symptoms: Systemic:  No fever, chills, fatigue, photophobia, stiff neck. Eye:  No redness, eye pain, discharge, blurred vision, or diplopia. ENT:  No nasal congestion, rhinorrhea, sinus pressure or pain, sneezing, earache, or sore throat.  No jaw claudication. Neuro:  No paresthesias, loss of consciousness, seizure activity, muscle weakness,  trouble with coordination or gait, trouble speaking or swallowing. Psych:  No depression, anxiety or trouble sleeping.  PMFSH:  Past medical history, family history, social history, meds, and allergies were reviewed.   Physical Exam:   Vital signs:  BP 152/95  Pulse 86  Temp(Src) 97.7 F (36.5 C) (Oral)  Resp 18  SpO2 99%  LMP 03/25/2013 General:  Alert and oriented.  In no distress. Eye:  Lids and conjunctivas normal.  PERRL,  Full EOMs.  Fundi benign with normal discs and vessels. ENT:  No cranial or facial tenderness to palpation.  TMs and canals clear.  Nasal mucosa was normal and uncongested without any drainage. No intra oral lesions, pharynx clear, mucous membranes moist, dentition normal. Neck:  Supple, full ROM, no tenderness to palpation.  No adenopathy or mass. Neuro:  Alert and orented times 3.  Speech was clear, fluent, and appropriate.  Cranial nerves intact. No pronator drift, muscle strength normal. Finger to nose normal.  DTRs were 2+ and symmetrical.Station and gait were normal.  Romberg's sign was normal.  Able to perform tandem gait well. Psych:  Normal affect.  Assessment:  The encounter diagnosis was Headache.  Red flag symptoms are her description of this as being the worst headache of her life, being different from her usual headache pattern, and also subjective stiff neck, although there no object of meningeal signs. I feel she needs a CT scan to rule out subarachnoid hemorrhage.  Plan:   The patient was transferred to the ED via shuttle in stable condition.  Medical Decision Making:  47 year old female has a 3 day history of  a throbbing occipital headache.  Red flag symptoms:  Worst headache in life, headache different from usual headaches, and subjective stiff neck.  Neuro exam is normal.  I am concerned about SAH and feel she needs a CT.     Reuben Likes, MD 05/07/13 [phone]

## 2013-05-07 NOTE — ED Notes (Signed)
Patient transported to CT 

## 2013-05-09 ENCOUNTER — Ambulatory Visit (INDEPENDENT_AMBULATORY_CARE_PROVIDER_SITE_OTHER): Payer: BC Managed Care – PPO | Admitting: Family Medicine

## 2013-05-09 VITALS — BP 138/77 | HR 78 | Temp 99.5°F | Ht 63.5 in | Wt 186.0 lb

## 2013-05-09 DIAGNOSIS — M25519 Pain in unspecified shoulder: Secondary | ICD-10-CM

## 2013-05-09 MED ORDER — CYCLOBENZAPRINE HCL 5 MG PO TABS: 5.0000 mg | ORAL_TABLET | Freq: Three times a day (TID) | ORAL | Status: DC | PRN

## 2013-05-09 NOTE — Progress Notes (Signed)
Family Medicine Office Visit Note   Subjective:   Patient ID: Laurie Perry, female  DOB: [birth year], 47 y.o.. MRN: [id number]   Pt that comes today for same day appointment concerned about her blood pressure. She has been taking her blood pressure at home and readins have been in the 160/100. She also reports last Thursday starting to have pain in the back of the neck. She took 2 tab of ibuprofen and the pain resolved completely. On Friday she had mild pain but she went to bed and slept without problem. On Saturday pain came back and patient went to the urgent care were was sent to the emergency department for further evaluation. Patient had CT scan of the head and chest XR with negative results, and was sent home with analgesic treatment.  Patient reports she never filled the Tramadol prescription and the neck pain has resolved, now she only has discomfort on the upper portion of her shoulders. She works as a Transport planner and was more concerned about her blood pressure issues.  Review of Systems:  Per HPI  Objective:   Physical Exam: Gen:  NAD HEENT: Moist mucous membranes  CV: Regular rate and rhythm, no murmurs rubs. PULM: Clear to auscultation bilaterally.  EXT: No edema MSK: Neck with full range of motion, no tenderness or stiffness. Tenderness to palpation an upper portion of the trapezius muscle, improving after deep massage.  Neuro: Alert and oriented x3. No neurologic focalization.   Assessment & Plan:

## 2013-05-09 NOTE — Patient Instructions (Signed)
Trigger Point Therapy Your exam shows your pain is coming from one or more trigger points. These points are places where the muscles of the neck, shoulder, back, and legs are tender to touch and cause pain, spasm, and fatigue. This condition can cause headaches, painful neck spasms, and low back pain. Trigger points can be thought of as weak areas of muscle that spasm and form hard knots when under chronic strain or stress. Treatment of trigger points may include medicines to reduce pain and inflammation and relax the muscles. Physical therapy can include several techniques. Deep friction massage over the tender areas can often help relieve pain and stiffness. Relaxation exercises and stretching may be helpful; cervical and lumbar traction can be used in severe cases. Cold therapy and hot packs have both proven helpful. Injection therapy with saline, anesthetics, or cortisone medicines, has also been very successful at reducing symptoms. Two or more injections are often needed over several weeks to gain the greatest benefit. Call your doctor to arrange for further treatment as recommended. Document Released: 08/21/2004 Document Revised: 10/06/2011 Document Reviewed: 07/14/2005 [facility] Patient Information 2014 [place], [place].

## 2013-05-09 NOTE — Assessment & Plan Note (Signed)
Trigger point identified on physical exam. Deep massage and Flexeril recommended. Regarding patient blood pressure: Pt brought her machine and blood pressures were not consistent with our manual and regular blood pressure readings. For example her machine read 170/105 on her blood pressure manual was 132/75. Discussed inpatient in depth and recommended a log of blood pressure using other blood pressure machines, and followup if BP is above 140/90. Encourage patient to make lifestyles changes for prevention.

## 2013-09-12 ENCOUNTER — Ambulatory Visit: Payer: BC Managed Care – PPO | Admitting: Family Medicine

## 2013-10-10 ENCOUNTER — Ambulatory Visit (INDEPENDENT_AMBULATORY_CARE_PROVIDER_SITE_OTHER): Payer: BC Managed Care – PPO | Admitting: Family Medicine

## 2013-10-10 ENCOUNTER — Encounter: Payer: Self-pay | Admitting: Family Medicine

## 2013-10-10 VITALS — BP 124/80 | Temp 98.5°F | Wt 186.0 lb

## 2013-10-10 DIAGNOSIS — M6790 Unspecified disorder of synovium and tendon, unspecified site: Secondary | ICD-10-CM

## 2013-10-10 DIAGNOSIS — M719 Bursopathy, unspecified: Secondary | ICD-10-CM

## 2013-10-10 NOTE — Patient Instructions (Signed)
It seems you have a lateral ankle tendinopathy.  Treatment starts with conservative management. - Rest - Use a ankle compression sleeve as a brace. - Trace the alphabet with your big toe. - Use ice and heat therapy. - Use Ibuprofen 600mg  3-4 times daily (OR naproxen 500mg  twice daily) with food for 2 weeks. - Follow up with me in 1 month.   Best! Hilton Sinclair, MD

## 2013-10-10 NOTE — Progress Notes (Signed)
Patient ID: Laurie Perry, female   DOB: 29-Sep-1965, 48 y.o.   MRN: [id number] Subjective:   CC: Left foot pain   HPI:   Patient has had left foot pain for 3 months that wakes her up and is 9/10. Worsened if she puts pressure on it and is more painful after resting/sleeping, first 30 minutes in the morning. Sleeping/resting makes it better. Pain is in arch, top of foot, and lateral ankle when she stands on it. She tries to wear soft-soled shoes. Denies erythema. Has mild swelling and mild instability that she is able to catch. 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 Does not wear brace. Ibuprofen helps some.    Review of Systems - Per HPI.   SH: Works as Materials engineer.  Objective:  Physical Exam BP 124/80  Temp(Src) 98.5 F (36.9 C) (Oral)  Wt 186 lb (84.369 kg)  LMP 10/09/2013 MSK: Ankle is not swollen or erythematous, has normal ROM, normal gait, normal ankle plantar and dorsiflexion. Tender lateral ankle (no single point) and dorsal surface of ankle joint and plantar surface of foot, pain with external rotation of ankle at lateral ankle.    Assessment:     Laurie Perry is a 48 y.o. female here for evaluation of left foot pain.    Plan:     Follow-up: Follow up in 1 month for follow-up of ankle / foot pain.   Hilton Sinclair, MD [place]

## 2013-10-18 DIAGNOSIS — M6790 Unspecified disorder of synovium and tendon, unspecified site: Secondary | ICD-10-CM | POA: Insufficient documentation

## 2013-10-18 NOTE — Assessment & Plan Note (Signed)
Lateral ankle tendinopathy.  - Conservative management with rest, compression, ice, and elevation. - Heat therapy. - ROM exercises (tracing alphabet with toes). - Ibuprofen 600mg  3-4x/day with food or naproxen 500mg  BID with food x 2 weeks. - F/u in 1 month.

## 2014-03-07 ENCOUNTER — Ambulatory Visit: Payer: BC Managed Care – PPO | Admitting: Neurology

## 2014-03-17 ENCOUNTER — Encounter: Payer: Self-pay | Admitting: Neurology

## 2014-03-17 ENCOUNTER — Ambulatory Visit (INDEPENDENT_AMBULATORY_CARE_PROVIDER_SITE_OTHER): Payer: BC Managed Care – PPO | Admitting: Neurology

## 2014-03-17 VITALS — BP 134/91 | HR 89 | Resp 14 | Ht 63.75 in | Wt 188.0 lb

## 2014-03-17 DIAGNOSIS — G4733 Obstructive sleep apnea (adult) (pediatric): Secondary | ICD-10-CM

## 2014-03-17 DIAGNOSIS — E669 Obesity, unspecified: Secondary | ICD-10-CM

## 2014-03-17 DIAGNOSIS — M2610 Unspecified anomaly of jaw-cranial base relationship: Secondary | ICD-10-CM

## 2014-03-17 DIAGNOSIS — Z9989 Dependence on other enabling machines and devices: Principal | ICD-10-CM

## 2014-03-17 DIAGNOSIS — M2619 Other specified anomalies of jaw-cranial base relationship: Secondary | ICD-10-CM

## 2014-03-17 NOTE — Progress Notes (Signed)
[facility]  Provider:  Larey Seat, M D  Referring Provider: Hilton Sinclair, * Primary Care Physician:  Conni Slipper, MD  No chief complaint on file.   HPI:  Laurie Perry is a 48 y.o. female  Is seen here as a  revisit  from Dr. Dianah Field for followup on sleep apnea.  Laurie Perry is originally from [place] was first referred to [place] by Dr. Ivar Bury  in November  2012, at the time she did not feel excessively daytime sleepy but her husband had told her that she was  snoring and sometimes not breathing regularly at night .  She endorsed vivid dreams and nocturia. She was working full time and caring for 2 young children and attributed her fatigue to that.  A polysomnography was obtained in November 2012 which documented an overall AHI of 22.6 and a REM AHI of 52.9 her supine AHI was 48.4.  Her history was positive for allergic bronchitis, some sinusitis,  baseline tachypnea of unknown origin, and asthma.  Later she also developed migrainous headaches . The  Epworth score was only  3 out of 24 points and the Beck Depression Inventory at one point.  At the time of her PSG , her body mass index was 32.4 and her neck circumference 15 inches she was not on medication at the time of her sleep study. The patient returned to be titrated to CPAP 10 cm water was recommended and the AHI at the setting was 1.5 per hour she had no PLMS , a normal sleep efficiency,  normal sleep architecture and normal EKG.  Laurie Perry.   brought her CPAP machine to today's visit, dated 03-17-14, with a residual download AHI of 0.3, an average daily time of CPAP he was off 6 hours and 25 minutes and very little air leak and a set pressure of 10 cm without EPR. The patient has a 87% compliance for over 4 hours of use. [name] would like to discontinue CPAP use, but her husband has already objected, he cannot sleep when she snores and position has no effect on her snoring and apnea. Her BMI has  remained high.     Last visit note:  7 /7/14 ( CD )  Today's Epworth sleepiness score is actually filled out at 7 points so higher than her original. nocturia and nightmares have resolved with CPAP.  CPAP download was obtained and 2012, 2013 no and July 2014 it shows a residual AHI of 0.3.  Another download obtained in the office today documents an AHI of 0.4 average daily used to time 5 hours 44 minutes the patient has been compliance since the initiation of CPAP therapy. She is followed by respiratory or in 2013 the patient was given a new mask different from the original use the sleep study which has provided more comfortable his air leaks.  The patient was to bed between 10 and 11 PM and arises in the morning at about 7 AM. She estimates her on file nocturnal sleep time at 7 hours or more. She still has to go to the bathroom once at night. The patient does not use any caffeine in daytime she does not nap in daytime. She has no shift work in his is to read and has had these regular sleep habits for many years.  She is physically active at work. She works as a Environmental manager.  She was recently diagnosed with migraine headaches which have alleviated by Topiramate and she  [facility] .         Review of Systems: Out of a complete 14 system review, the patient complains of only the following symptoms, and all other reviewed systems are negative.  Fatigue severity score and Doris date 11 points, Epworth sleepiness score at one point. These are very good results 87% compliance with CPAP he was with an average daily time of use of 6 hours 25 minutes and residual AHI of 0.3.  We will try to change to a nasal mask. IV in 12 months with CPAP.   History   Social History  . Marital Status: Married    Spouse Name: Kodjo    Number of Children: 1  . Years of Education: college   Occupational History  .      [employer]   Social History Main Topics   . Smoking status: Never Smoker   . Smokeless tobacco: Never Used  . Alcohol Use: 0.5 oz/week    1 drink(s) per week     Comment: occasional  . Drug Use: No  . Sexual Activity: Not on file   Other Topics Concern  . Not on file   Social History Narrative   Patient lives at home with her husband Laurie Perry) and her mother and son.Patient has a college education. Patient works at [employer].   Right handed.   Caffeine- None.    Family History  Problem Relation Age of Onset  . Hypertension Mother   . Arthritis Mother   . High blood pressure Father     Past Medical History  Diagnosis Date  . GERD (gastroesophageal reflux disease)   . Uterine fibroids in pregnancy, postpartum condition   . Ovarian cyst   . OSA (obstructive sleep apnea)     severe  . Bronchitis   . Obesity   . Migraines     Past Surgical History  Procedure Laterality Date  . Cesarean section      2012    Current Outpatient Prescriptions  Medication Sig Dispense Refill  . ibuprofen (ADVIL,MOTRIN) 800 MG tablet Take 800 mg by mouth every 8 (eight) hours as needed for pain.       . Multiple Vitamin (MULTIVITAMIN WITH MINERALS) TABS tablet Take 1 tablet by mouth. As needed       No current facility-administered medications for this visit.    Allergies as of 03/17/2014 - Review Complete 03/17/2014  Allergen Reaction Noted  . Other  07/16/2012    Vitals: BP 134/91  Pulse 89  Resp 14  Ht 5' 3.75" (1.619 m)  Wt 188 lb (85.276 kg)  BMI 32.53 kg/m2 Last Weight:  Wt Readings from Last 1 Encounters:  03/17/14 188 lb (85.276 kg)   Last Height:   Ht Readings from Last 1 Encounters:  03/17/14 5' 3.75" (1.619 m)    Physical exam:  General: The patient is awake, alert and appears not in acute distress. The patient is well groomed. Head: Normocephalic, atraumatic. Neck is supple. Mallampati 2  neck circumference: 16 Mild  retrognathia , no nasal deviation,  Seasonal rhinitis.  Cardiovascular:  Regular rate  and rhythm without  murmurs or carotid bruit, and without distended neck veins. Respiratory: Lungs are clear to auscultation. Skin:  Without evidence of edema, or rash Trunk: BMI is elevated and patient  has normal posture.  Neurologic exam : The patient is awake and alert, oriented to place and time.   Memory subjective  described  as intact. There is a normal attention span & concentration ability.  Speech is fluent with dysarthria, dysphonia . Mood and affect are appropriate.  Cranial nerves: Pupils are equal and briskly reactive to light. The patient has cloudy, possibly neo-vascularized areas at the rim of both irises.  Funduscopic exam without  evidence of pallor or edema. Extraocular movements  in vertical and horizontal planes intact and without nystagmus. Visual fields by finger perimetry are intact. Hearing to finger rub intact.  Facial sensation intact to fine touch.  Facial motor strength is symmetric and tongue and uvula move midline.  Motor exam:   Normal tone and muscle bulk and symmetric normal strength in all extremities.  Sensory:  Fine touch, pinprick and vibration were tested in all extremities. Proprioception is normal.  Coordination: Rapid alternating movements in the fingers/hands is tested and normal. .  Gait and station: Patient walks without assistive device, testing is normal.  Deep tendon reflexes: in the  upper and lower extremities are symmetric and intact.    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  She has a high grade mallomati, large tongue and retrognathia.   Plan:  Treatment plan and additional workup : continue CPAP - documented 87% compliance for her .    DIET and exercise instructions. 2 days out of  5 diet explained, fasting intermittently . ( Dr. Berline Chough ) . Continue exercise ,   Continue CPAP use.  Change to nasal mask or nasal pillow for comfort .

## 2014-03-18 ENCOUNTER — Encounter (HOSPITAL_COMMUNITY): Payer: Self-pay | Admitting: Emergency Medicine

## 2014-03-18 ENCOUNTER — Emergency Department (HOSPITAL_COMMUNITY)
Admission: EM | Admit: 2014-03-18 | Discharge: 2014-03-18 | Disposition: A | Discharge status: Home / Self Care | Payer: BC Managed Care – PPO | Source: Home / Self Care | Attending: Family Medicine | Admitting: Family Medicine

## 2014-03-18 DIAGNOSIS — J020 Streptococcal pharyngitis: Secondary | ICD-10-CM

## 2014-03-18 LAB — POCT PREGNANCY, URINE: PREG TEST UR: NEGATIVE

## 2014-03-18 MED ORDER — CEFDINIR 300 MG PO CAPS: 300.0000 mg | ORAL_CAPSULE | Freq: Two times a day (BID) | ORAL | Status: DC

## 2014-03-18 NOTE — Discharge Instructions (Signed)
Drink lots of fluids, take all of medicine, use lozenges as needed treat fever as needed. See your doctor if needed

## 2014-03-18 NOTE — ED Notes (Signed)
Discharge delay secondary to patient requesting pregnancy test

## 2014-03-18 NOTE — ED Provider Notes (Addendum)
CSN: [id number]     Arrival date & time 03/18/14  1541 History   First MD Initiated Contact with Patient 03/18/14 1559     Chief Complaint  Patient presents with  . Sore Throat   (Consider location/radiation/quality/duration/timing/severity/associated sxs/prior Treatment) Patient is a 48 y.o. female presenting with pharyngitis. The history is provided by the patient.  Sore Throat This is a new problem. The current episode started 2 days ago. The problem has been gradually worsening. Pertinent negatives include no chest pain, no abdominal pain, no headaches and no shortness of breath. The symptoms are aggravated by swallowing.    Past Medical History  Diagnosis Date  . GERD (gastroesophageal reflux disease)   . Uterine fibroids in pregnancy, postpartum condition   . Ovarian cyst   . OSA (obstructive sleep apnea)     severe  . Bronchitis   . Obesity   . Migraines    Past Surgical History  Procedure Laterality Date  . Cesarean section      2012   Family History  Problem Relation Age of Onset  . Hypertension Mother   . Arthritis Mother   . High blood pressure Father    History  Substance Use Topics  . Smoking status: Never Smoker   . Smokeless tobacco: Never Used  . Alcohol Use: 0.5 oz/week    1 drink(s) per week     Comment: occasional   OB History   Grav Para Term Preterm Abortions TAB SAB Ect Mult Living   2 1  1      1      Review of Systems  Constitutional: Positive for fever and chills.  HENT: Positive for sore throat. Negative for trouble swallowing.   Respiratory: Negative.  Negative for shortness of breath.   Cardiovascular: Negative.  Negative for chest pain.  Gastrointestinal: Negative.  Negative for abdominal pain.  Genitourinary: Negative.   Neurological: Negative for headaches.    Allergies  Other  Home Medications   Prior to Admission medications   Medication Sig Start Date End Date Taking? Authorizing Provider  cefdinir (OMNICEF) 300 MG  capsule Take 1 capsule (300 mg total) by mouth 2 (two) times daily. 03/18/14   Billy Fischer, MD  ibuprofen (ADVIL,MOTRIN) 800 MG tablet Take 800 mg by mouth every 8 (eight) hours as needed for pain.  02/16/13   Historical Provider, MD  Multiple Vitamin (MULTIVITAMIN WITH MINERALS) TABS tablet Take 1 tablet by mouth. As needed    Historical Provider, MD   BP 147/98  Pulse 77  Temp(Src) 99.5 F (37.5 C) (Oral)  Resp 18  SpO2 100%  LMP 01/29/2014 Physical Exam  Nursing note and vitals reviewed. Constitutional: She is oriented to person, place, and time. She appears well-developed and well-nourished. No distress.  HENT:  Head: Normocephalic.  Right Ear: External ear normal.  Left Ear: External ear normal.  Mouth/Throat: Uvula is midline and mucous membranes are normal. Oropharyngeal exudate and posterior oropharyngeal erythema present.  Eyes: Pupils are equal, round, and reactive to light.  Neck: Normal range of motion. Neck supple.  Cardiovascular: Normal heart sounds.   Pulmonary/Chest: Effort normal and breath sounds normal.  Lymphadenopathy:    She has cervical adenopathy.  Neurological: She is alert and oriented to person, place, and time.  Skin: Skin is warm and dry. No rash noted.    ED Course  Procedures (including critical care time) Labs Review Labs Reviewed - No data to display upreg neg. Imaging Review No results found.  MDM   1. Streptococcal sore throat        Billy Fischer, MD 03/18/14 3710  Billy Fischer, MD 03/18/14 [phone]

## 2014-03-18 NOTE — ED Notes (Signed)
Chills, feverish, sore throat, heavy head and abdomen.  Onset of symptoms started Thursday 8/20

## 2014-05-29 ENCOUNTER — Encounter: Payer: Self-pay | Admitting: Family Medicine

## 2014-05-29 ENCOUNTER — Ambulatory Visit (INDEPENDENT_AMBULATORY_CARE_PROVIDER_SITE_OTHER): Payer: BC Managed Care – PPO | Admitting: Family Medicine

## 2014-05-29 VITALS — BP 154/102 | HR 74 | Temp 98.2°F | Ht 62.5 in | Wt 182.2 lb

## 2014-05-29 DIAGNOSIS — S39012A Strain of muscle, fascia and tendon of lower back, initial encounter: Secondary | ICD-10-CM | POA: Insufficient documentation

## 2014-05-29 MED ORDER — MELOXICAM 15 MG PO TABS: 15.0000 mg | ORAL_TABLET | Freq: Every day | ORAL | Status: DC

## 2014-05-29 MED ORDER — CYCLOBENZAPRINE HCL 10 MG PO TABS: 10.0000 mg | ORAL_TABLET | Freq: Three times a day (TID) | ORAL | Status: DC | PRN

## 2014-05-29 NOTE — Progress Notes (Signed)
Patient ID: Laurie Perry, female   DOB: [birth year], 48 y.o.   MRN: [id number]   Subjective:  Laurie Perry is a 48 y.o. female here for back pain.   She reports feeling sudden onset of severe pain in her lower back on 05/18/2014 while standing and talking to a coworker. 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 She saw an urgent care provider that day and received analgesic and muscle relaxant which caused itching and heaviness in her legs so she stopped taking these and when she returned to the urgent care she was prescribed 5 days of steroids which she has completed. She reports her pain has resolved somewhat since then.   Today he pain is described as severe, constant in her lower back with radiation up the right side of her back when she is standing. Pain is worse when standing and still present at rest.    Denies fever, headache, cough, CP, palpitations, SOB, abdominal pain, N/V/D, blood in stool, change in bladder habits, rash, urinary or fecal incontinence and saddle anesthesia.  Objective:  BP 154/102 mmHg  Pulse 74  Temp(Src) 98.2 F (36.8 C) (Oral)  Ht 5' 2.5" (1.588 m)  Wt 182 lb 3.2 oz (82.645 kg)  BMI 32.77 kg/m2  LMP 05/23/2014  Gen:  48 y.o. female in NAD Back:  Normal skin. Spine with normal alignment and no deformity. No tenderness to vertebral process palpation. Paraspinous muscles are not tender and without spasm.   Range of motion is full at neck and lumbar sacral regions. Straight leg raise is negative.  Neuro:  Sensation and motor function 5/5 bilateral lower extremities. Patellar and achilles DTR's 2+ Assessment & Plan:  [name] is a 48 y.o. female with lumbar/thoracic muscle spasm.

## 2014-05-29 NOTE — Patient Instructions (Signed)
Take the medications as we discussed and apply heat to your back. I believe your muscle spasms are the cause of your pain and there is no indication for imaging at this point. I expect you will be better within 72 hours, but you should schedule a follow up appointment on Thursday to reassess.

## 2014-05-29 NOTE — Assessment & Plan Note (Signed)
Worker's comp case opened. History of pain onset not consistent with traumatic etiology, though I suspect repeated use of back muscles has contributed to this episode. No indication for imaging at this time. No red flags.  - Rx flexeril and mobic to release strain and improve inflammatory component of pain.  - Anticipate quick resolution. Will likely return to work within 72 hours.

## 2014-06-02 ENCOUNTER — Ambulatory Visit (INDEPENDENT_AMBULATORY_CARE_PROVIDER_SITE_OTHER): Payer: BC Managed Care – PPO | Admitting: Family Medicine

## 2014-06-02 ENCOUNTER — Encounter: Payer: Self-pay | Admitting: Family Medicine

## 2014-06-02 VITALS — BP 158/91 | HR 88 | Temp 98.1°F | Ht 63.0 in | Wt 190.0 lb

## 2014-06-02 DIAGNOSIS — S39012D Strain of muscle, fascia and tendon of lower back, subsequent encounter: Secondary | ICD-10-CM

## 2014-06-02 NOTE — Progress Notes (Signed)
   HPI  Patient presents today for back pain follow-up.  Patient was recently seen in the clinic for follow-up on her back pain with Dr. Bonner Puna. She states that her pain is relatively improved with the medications Dr. Bonner Puna prescribed her at her last visit. She was prescribed Mobic and Flexeril. Patient states that she is been out or limited at work for the past 2 weeks since the onset of her symptoms. Symptoms are localized to her right low back and buttock. Pain is exacerbated with standing for prolonged period and walking. She has been attempting some stretching exercises. She has also been using heat packs. Pain is localized, does not radiate, is not electric in nature. She is not experienced any weakness or numbness. No bowel or bladder incontinence.  Smoking status noted ROS: Per HPI  Objective: BP 158/91 mmHg  Pulse 88  Temp(Src) 98.1 F (36.7 C) (Oral)  Ht 5\' 3"  (1.6 m)  Wt 190 lb (86.183 kg)  BMI 33.67 kg/m2  LMP 05/23/2014 Gen: NAD, alert, cooperative with exam HEENT: NCAT, EOMI, PERRL CV: RRR, good S1/S2, no murmur Resp: CTABL, no wheezes, non-labored Abd: SNTND, BS present, no guarding or organomegaly Ext: No edema, warm; straight leg raise yielded significant right-sided low back pain without radiation to the knee. Tender to palpation along lumbar paraspinal region.  Neuro: Alert and oriented, No gross deficits. Sensation and strength intact bilaterally.  Assessment and plan:  Strain of lumbar paraspinal muscle Suspect muscle strain of right-sided lumbar paraspinal or quadratus lumborum.  Patient was instructed to continue current meloxicam regimen. Patient is reported some pain relief with this medication. I informed her that if she has any need for additional pain relief Tylenol can be used for breakthrough pain.  She is to continue her current Flexeril regimen. I informed her that she should not be taking Flexeril while working in her line of work. I recommended that  she discontinue any daily use of Flexeril during the weekdays in which she works, and that if needed she could take it at night after she has completed work.  I recommended that patient continue using heat and ice on the affected area. As well as continuing daily stretching exercises to help alleviate and manipulate injured area.  I provided patient with an absence from work better for yesterday and today. In this letter I recommended lift restrictions of 30 pounds for the first week back at work. I informed patient of this recommendation. She was not pleased about this recommendation. She asked that I deleted from the letter. I hand wrote on her copy that if patient felt that she could complete her duties appropriately without pain then I did not feel as though those lifting restrictions were entirely necessary.    No orders of the defined types were placed in this encounter.    No orders of the defined types were placed in this encounter.     Elberta Leatherwood, MD,MS,  PGY1 06/02/2014 4:46 PM

## 2014-06-02 NOTE — Assessment & Plan Note (Signed)
Suspect muscle strain of right-sided lumbar paraspinal or quadratus lumborum.  Patient was instructed to continue current meloxicam regimen. Patient is reported some pain relief with this medication. I informed her that if she has any need for additional pain relief Tylenol can be used for breakthrough pain.  She is to continue her current Flexeril regimen. I informed her that she should not be taking Flexeril while working in her line of work. I recommended that she discontinue any daily use of Flexeril during the weekdays in which she works, and that if needed she could take it at night after she has completed work.  I recommended that patient continue using heat and ice on the affected area. As well as continuing daily stretching exercises to help alleviate and manipulate injured area.  I provided patient with an absence from work better for yesterday and today. In this letter I recommended lift restrictions of 30 pounds for the first week back at work. I informed patient of this recommendation. She was not pleased about this recommendation. She asked that I deleted from the letter. I hand wrote on her copy that if patient felt that she could complete her duties appropriately without pain then I did not feel as though those lifting restrictions were entirely necessary.

## 2014-06-02 NOTE — Patient Instructions (Signed)
It was a pleasure seeing you today in our clinic. Today we discussed back pain. Here is the treatment plan we have discussed and agreed upon together:   - I recommend continuing your prescribed Mobic until completion of this prescription. - using ice and heat to alleviate soft tissue pain may prove to be beneficial. - continue to perform regular stretching exercises. - Continue your current Flexeril regimen. I strongly recommend discontinuing Flexeril use during the days in which she work. If needed, you may still use Flexeril at night. - I have recommended a lift restriction and for when you return back to work. - Do not hesitate to call our office if you have any worsening of symptoms. - If you experience any lower extremity weakness or actually for a loss of bowel/bladder control it is very important to report to the emergency department immediately.

## 2014-06-07 ENCOUNTER — Encounter: Payer: Self-pay | Admitting: Neurology

## 2014-06-07 ENCOUNTER — Telehealth: Payer: Self-pay | Admitting: Neurology

## 2014-06-07 NOTE — Telephone Encounter (Signed)
Printed and mailed letter regarding adjusted appointment time on 03/21/15 per Dr. Edwena Felty schedule.

## 2014-08-10 IMAGING — CR DG CHEST 2V
2 series · 2 of 2 positions shown · non-contrast
Comparison: 02/16/2008 at [HOSPITAL].

CLINICAL DATA: Head and upper neck pain. No reported chest
symptoms.

EXAM:
CHEST  2 VIEW

[w chest pa]
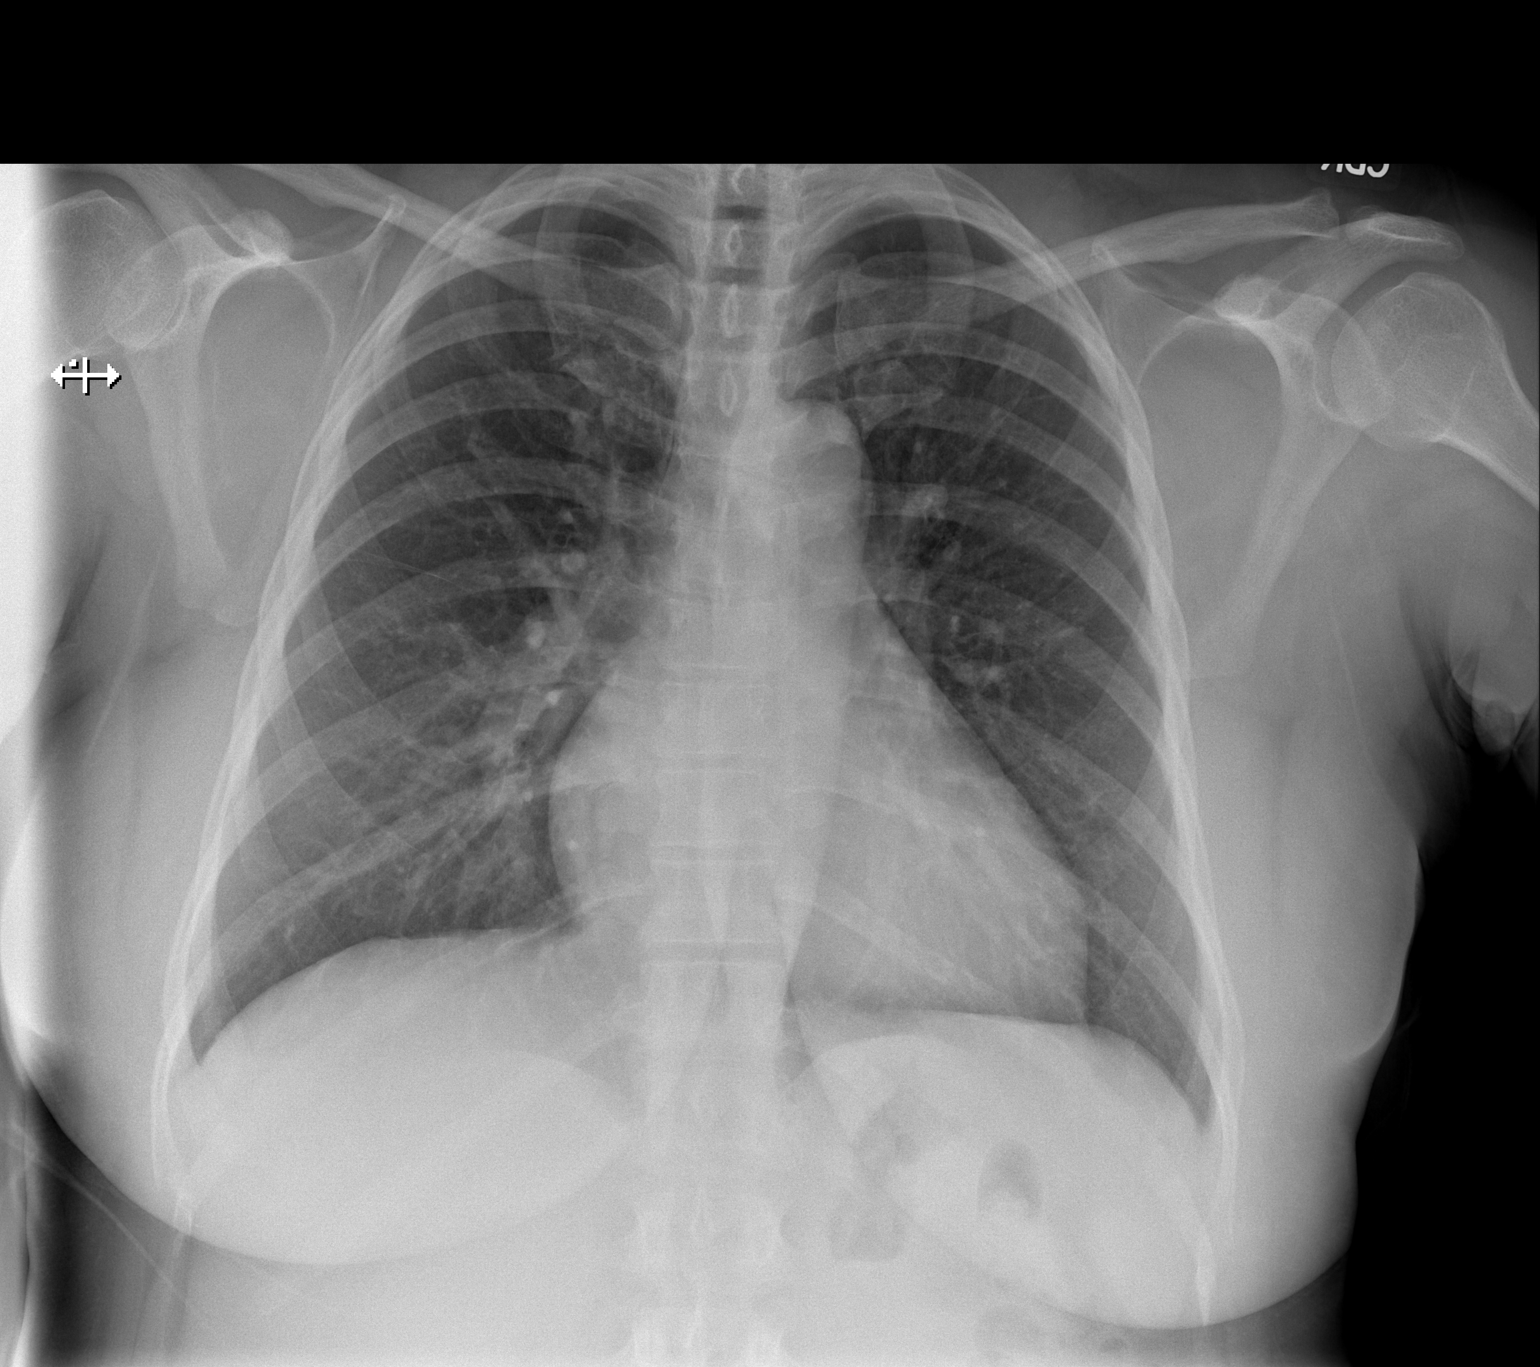

[w chest lat]
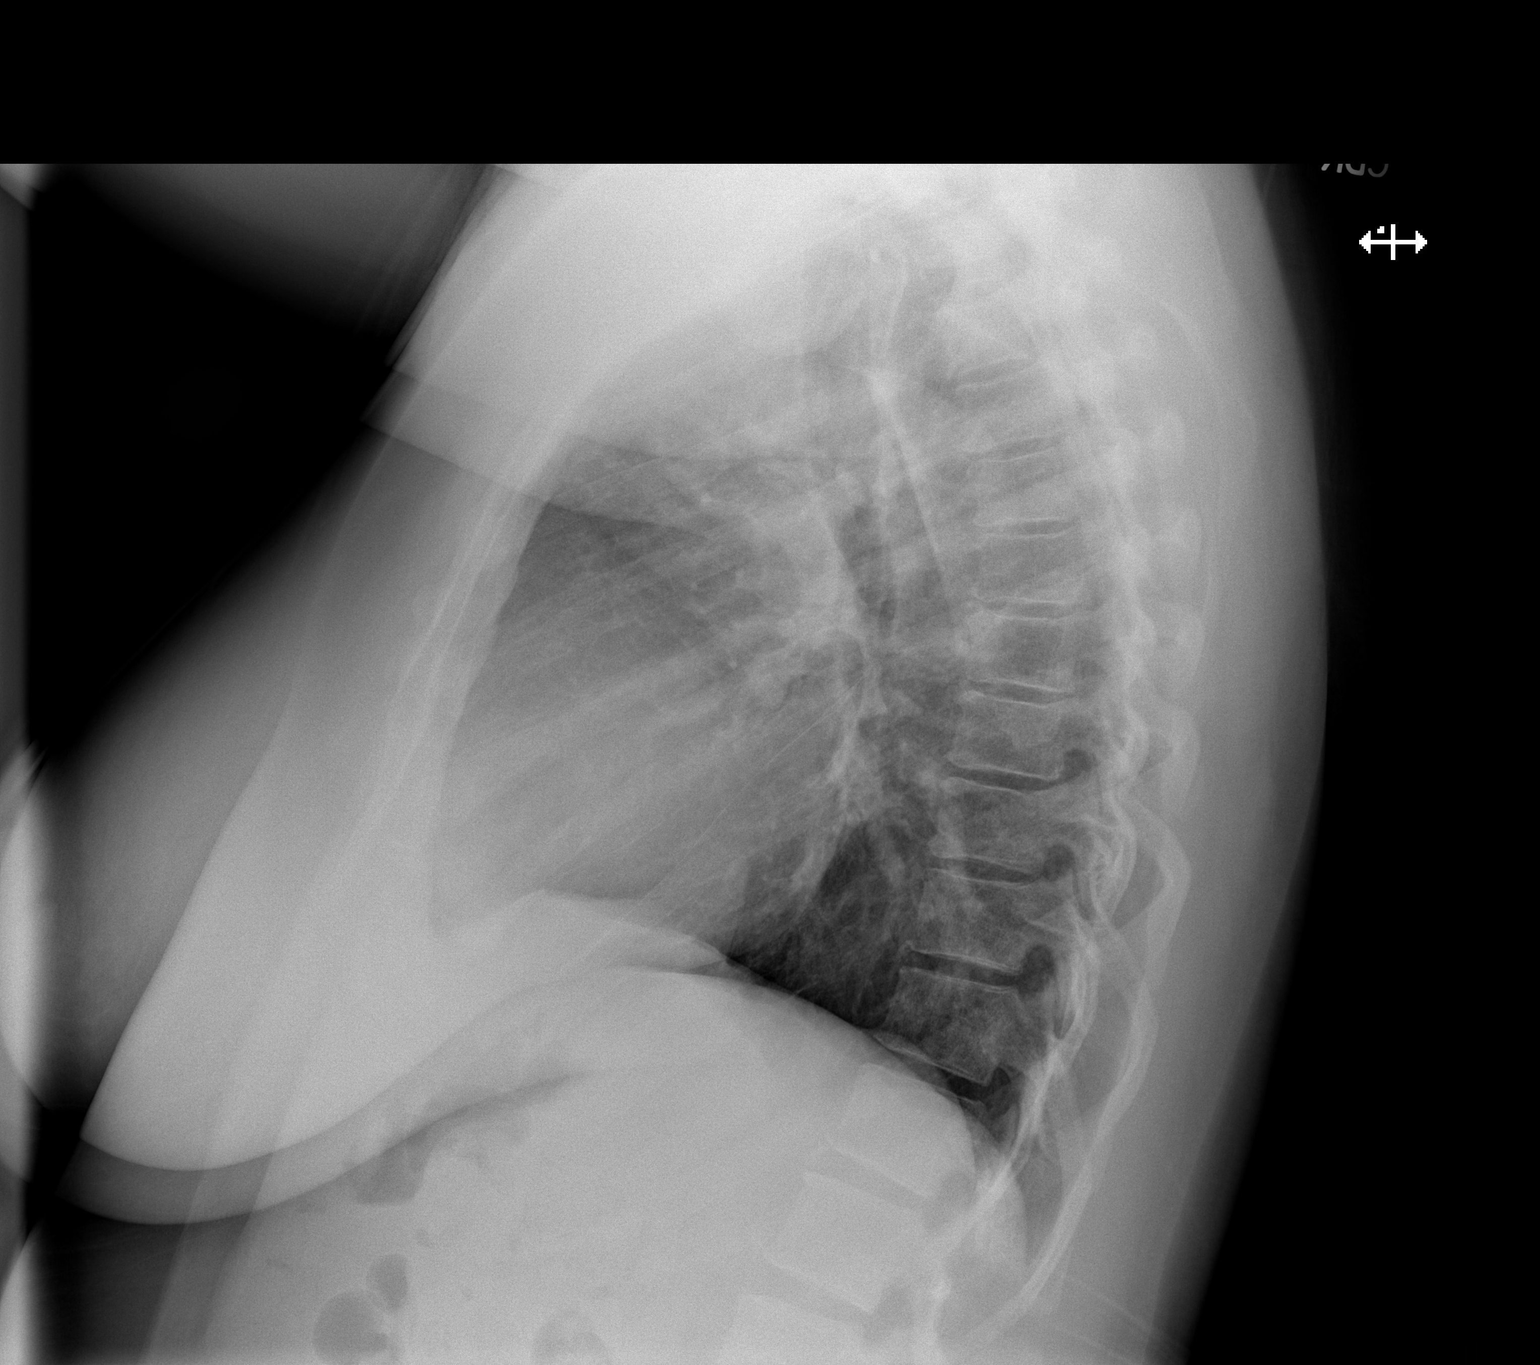

[2 of 2 positions shown; findings below may reference images not displayed]

FINDINGS: Interval borderline enlargement of the cardiac silhouette and mild
diffuse peribronchial thickening. Clear lungs. Unremarkable bones.
IMPRESSION: Interval borderline cardiomegaly and mild chronic bronchitic
changes.

## 2014-10-01 ENCOUNTER — Encounter (HOSPITAL_COMMUNITY): Payer: Self-pay | Admitting: Emergency Medicine

## 2014-10-01 ENCOUNTER — Emergency Department (HOSPITAL_COMMUNITY): Payer: BLUE CROSS/BLUE SHIELD

## 2014-10-01 ENCOUNTER — Emergency Department (HOSPITAL_COMMUNITY)
Admission: EM | Admit: 2014-10-01 | Discharge: 2014-10-01 | Disposition: A | Discharge status: Home / Self Care | Payer: BLUE CROSS/BLUE SHIELD | Attending: Emergency Medicine | Admitting: Emergency Medicine

## 2014-10-01 DIAGNOSIS — E669 Obesity, unspecified: Secondary | ICD-10-CM | POA: Diagnosis not present

## 2014-10-01 DIAGNOSIS — Z8669 Personal history of other diseases of the nervous system and sense organs: Secondary | ICD-10-CM | POA: Insufficient documentation

## 2014-10-01 DIAGNOSIS — Z8719 Personal history of other diseases of the digestive system: Secondary | ICD-10-CM | POA: Insufficient documentation

## 2014-10-01 DIAGNOSIS — Z8709 Personal history of other diseases of the respiratory system: Secondary | ICD-10-CM | POA: Diagnosis not present

## 2014-10-01 DIAGNOSIS — R079 Chest pain, unspecified: Secondary | ICD-10-CM | POA: Diagnosis not present

## 2014-10-01 DIAGNOSIS — Z8679 Personal history of other diseases of the circulatory system: Secondary | ICD-10-CM | POA: Diagnosis not present

## 2014-10-01 DIAGNOSIS — Z8742 Personal history of other diseases of the female genital tract: Secondary | ICD-10-CM | POA: Insufficient documentation

## 2014-10-01 LAB — CBC WITH DIFFERENTIAL/PLATELET
Basophils Absolute: 0 10*3/uL (ref 0.0–0.1)
Basophils Relative: 1 % (ref 0–1)
Eosinophils Absolute: 0.1 10*3/uL (ref 0.0–0.7)
Eosinophils Relative: 1 % (ref 0–5)
HCT: 34.5 % — ABNORMAL LOW (ref 36.0–46.0)
Hemoglobin: 11.6 g/dL — ABNORMAL LOW (ref 12.0–15.0)
Lymphocytes Relative: 42 % (ref 12–46)
Lymphs Abs: 2.4 10*3/uL (ref 0.7–4.0)
MCH: 30.7 pg (ref 26.0–34.0)
MCHC: 33.6 g/dL (ref 30.0–36.0)
MCV: 91.3 fL (ref 78.0–100.0)
Monocytes Absolute: 0.3 10*3/uL (ref 0.1–1.0)
Monocytes Relative: 6 % (ref 3–12)
Neutro Abs: 2.9 10*3/uL (ref 1.7–7.7)
Neutrophils Relative %: 50 % (ref 43–77)
Platelets: 265 10*3/uL (ref 150–400)
RBC: 3.78 MIL/uL — ABNORMAL LOW (ref 3.87–5.11)
RDW: 12 % (ref 11.5–15.5)
WBC: 5.7 10*3/uL (ref 4.0–10.5)

## 2014-10-01 LAB — BASIC METABOLIC PANEL
Anion gap: 4 — ABNORMAL LOW (ref 5–15)
BUN: 11 mg/dL (ref 6–23)
CO2: 30 mmol/L (ref 19–32)
Calcium: 9.1 mg/dL (ref 8.4–10.5)
Chloride: 101 mmol/L (ref 96–112)
Creatinine, Ser: 0.84 mg/dL (ref 0.50–1.10)
GFR calc Af Amer: >90 mL/min (ref >90)
GFR calc non Af Amer: 82 mL/min — ABNORMAL LOW (ref >90)
Glucose, Bld: 87 mg/dL (ref 70–99)
Potassium: 4 mmol/L (ref 3.5–5.1)
Sodium: 135 mmol/L (ref 135–145)

## 2014-10-01 LAB — I-STAT TROPONIN, ED: Troponin i, poc: 0 ng/mL (ref 0.00–0.08)

## 2014-10-01 NOTE — ED Notes (Signed)
Pt started taking 200mg  of CoQ10 daily 3 weeks ago and started feeling chest pain and palpitations. She then cut the dosage down to 100mg  daily and had the same feeling. She stopped taking it all together 3 days ago.  She still has same symptoms so she came in to get evaluated.

## 2014-10-01 NOTE — ED Notes (Signed)
Pt leaving for XRay. 

## 2014-10-01 NOTE — ED Notes (Signed)
Pt sts mid sternal CP x weeks intermittently worse with some movement and after taking new supplement; pt sts feels palpitations at times but denies at present

## 2014-10-01 NOTE — ED Notes (Signed)
Pt back from X-Ray.  

## 2014-10-09 NOTE — ED Provider Notes (Signed)
CSN: [id number]     Arrival date & time 10/01/14  1124 History   First MD Initiated Contact with Patient 10/01/14 1228     Chief Complaint  Patient presents with  . Chest Pain     (Consider location/radiation/quality/duration/timing/severity/associated sxs/prior Treatment) HPI   49 year old female with chest pain. Midsternal. Onset about 2 weeks ago. Intermittent. Sometimes worse with movement. No appreciable exacerbating relieving factors otherwise. Has been able to exert herself for the past couple weeks 107 change in her symptoms. No shortness of breath. Occasionally some palpitations. No unusual leg pain or swelling. No history similar type pain. Patient recently started taking CoQ10 and feels like her symptoms may be precipitated by this.  Past Medical History  Diagnosis Date  . GERD (gastroesophageal reflux disease)   . Uterine fibroids in pregnancy, postpartum condition   . Ovarian cyst   . OSA (obstructive sleep apnea)     severe  . Bronchitis   . Obesity   . Migraines    Past Surgical History  Procedure Laterality Date  . Cesarean section      2012   Family History  Problem Relation Age of Onset  . Hypertension Mother   . Arthritis Mother   . High blood pressure Father    History  Substance Use Topics  . Smoking status: Never Smoker   . Smokeless tobacco: Never Used  . Alcohol Use: 0.5 oz/week    1 drink(s) per week     Comment: occasional   OB History    Gravida Para Term Preterm AB TAB SAB Ectopic Multiple Living   2 1  1      1      Review of Systems  All systems reviewed and negative, other than as noted in HPI.   Allergies  Other and Tramadol  Home Medications   Prior to Admission medications   Medication Sig Start Date End Date Taking? Authorizing Provider  Multiple Vitamin (MULTIVITAMIN WITH MINERALS) TABS tablet Take 1 tablet by mouth. As needed   Yes Historical Provider, MD   BP 140/85 mmHg  Pulse 71  Temp(Src) 98.1 F (36.7 C) (Oral)   Resp 18  SpO2 99%  LMP 09/09/2014 Physical Exam  Constitutional: She appears well-developed and well-nourished. No distress.  HENT:  Head: Normocephalic and atraumatic.  Eyes: Conjunctivae are normal. Right eye exhibits no discharge. Left eye exhibits no discharge.  Neck: Neck supple.  Cardiovascular: Normal rate, regular rhythm and normal heart sounds.  Exam reveals no gallop and no friction rub.   No murmur heard. Pulmonary/Chest: Effort normal and breath sounds normal. No respiratory distress.  Abdominal: Soft. She exhibits no distension. There is no tenderness.  Genitourinary:  Lower extremities symmetric as compared to each other. No calf tenderness. Negative Homan's. No palpable cords.   Musculoskeletal: She exhibits no edema or tenderness.  Neurological: She is alert.  Skin: Skin is warm and dry.  Psychiatric: She has a normal mood and affect. Her behavior is normal. Thought content normal.  Nursing note and vitals reviewed.   ED Course  Procedures (including critical care time) Labs Review Labs Reviewed  BASIC METABOLIC PANEL - Abnormal; Notable for the following:    GFR calc non Af Amer 82 (*)    Anion gap 4 (*)    All other components within normal limits  CBC WITH DIFFERENTIAL/PLATELET - Abnormal; Notable for the following:    RBC 3.78 (*)    Hemoglobin 11.6 (*)    HCT 34.5 (*)  All other components within normal limits  I-STAT TROPOININ, ED    Imaging Review No results found.   Dg Chest 2 View  10/01/2014   CLINICAL DATA:  Initial evaluation mid sternal chest pain which began last night  EXAM: CHEST  2 VIEW  COMPARISON:  05/07/2013  FINDINGS: Upper normal but stable heart size. Vascular pattern normal. No infiltrate or effusion. Bony thorax intact.  IMPRESSION: No active cardiopulmonary disease.   Electronically Signed   By: Skipper Cliche M.D.   On: 10/01/2014 14:16     EKG Interpretation   Date/Time:  Sunday October 01 2014 11:28:22 EST Ventricular  Rate:  70 PR Interval:  186 QRS Duration: 78 QT Interval:  394 QTC Calculation: 425 R Axis:   72 Text Interpretation:  Normal sinus rhythm Nonspecific T wave abnormality  No old tracing to compare Confirmed by Hakan Nudelman  MD, Zehava Turski ([id number]) on  10/01/2014 12:47:11 PM      MDM   Final diagnoses:  Chest pain, unspecified chest pain type     48  old female with chest pain. Atypical for ACS. Doubt PE, infectious, dissection or other serious etiology. Workup has been fairly unremarkable. Impression stable for discharge at this time. Recommending stopping CoQ10. It has been determined that no acute conditions requiring further emergency intervention are present at this time. The patient has been advised of the diagnosis and plan. I reviewed any labs and imaging including any potential incidental findings. We have discussed signs and symptoms that warrant return to the ED and they are listed in the discharge instructions.    Virgel Manifold, MD 10/09/14 [phone]

## 2015-03-21 ENCOUNTER — Ambulatory Visit: Payer: BC Managed Care – PPO | Admitting: Neurology

## 2015-03-22 ENCOUNTER — Encounter: Payer: Self-pay | Admitting: Neurology

## 2016-01-04 IMAGING — CR DG CHEST 2V
2 series · 2 of 2 positions shown · non-contrast
Comparison: 05/07/2013

CLINICAL DATA: Initial evaluation mid sternal chest pain which
began last night

EXAM:
CHEST  2 VIEW

[w chest pa]
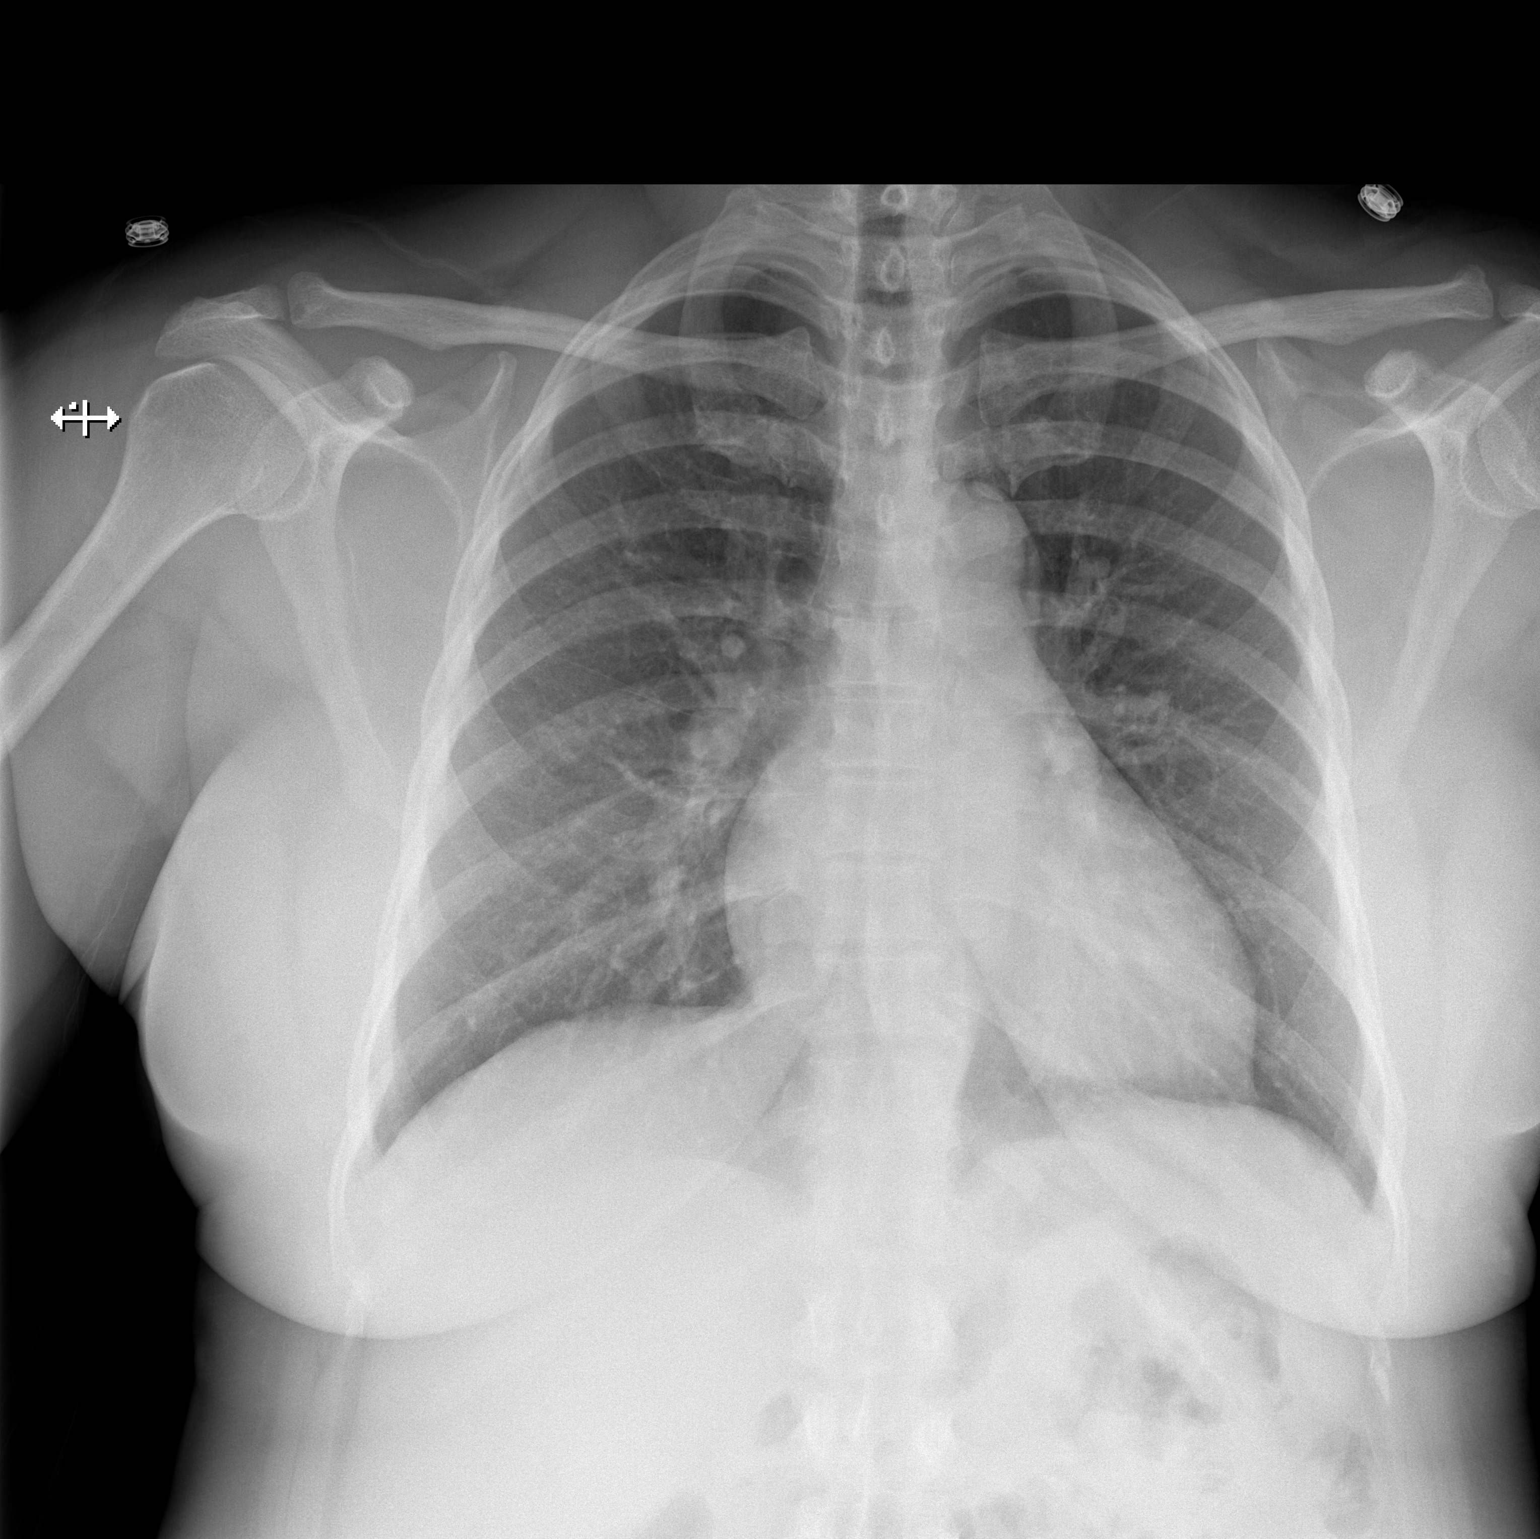

[w chest lat]
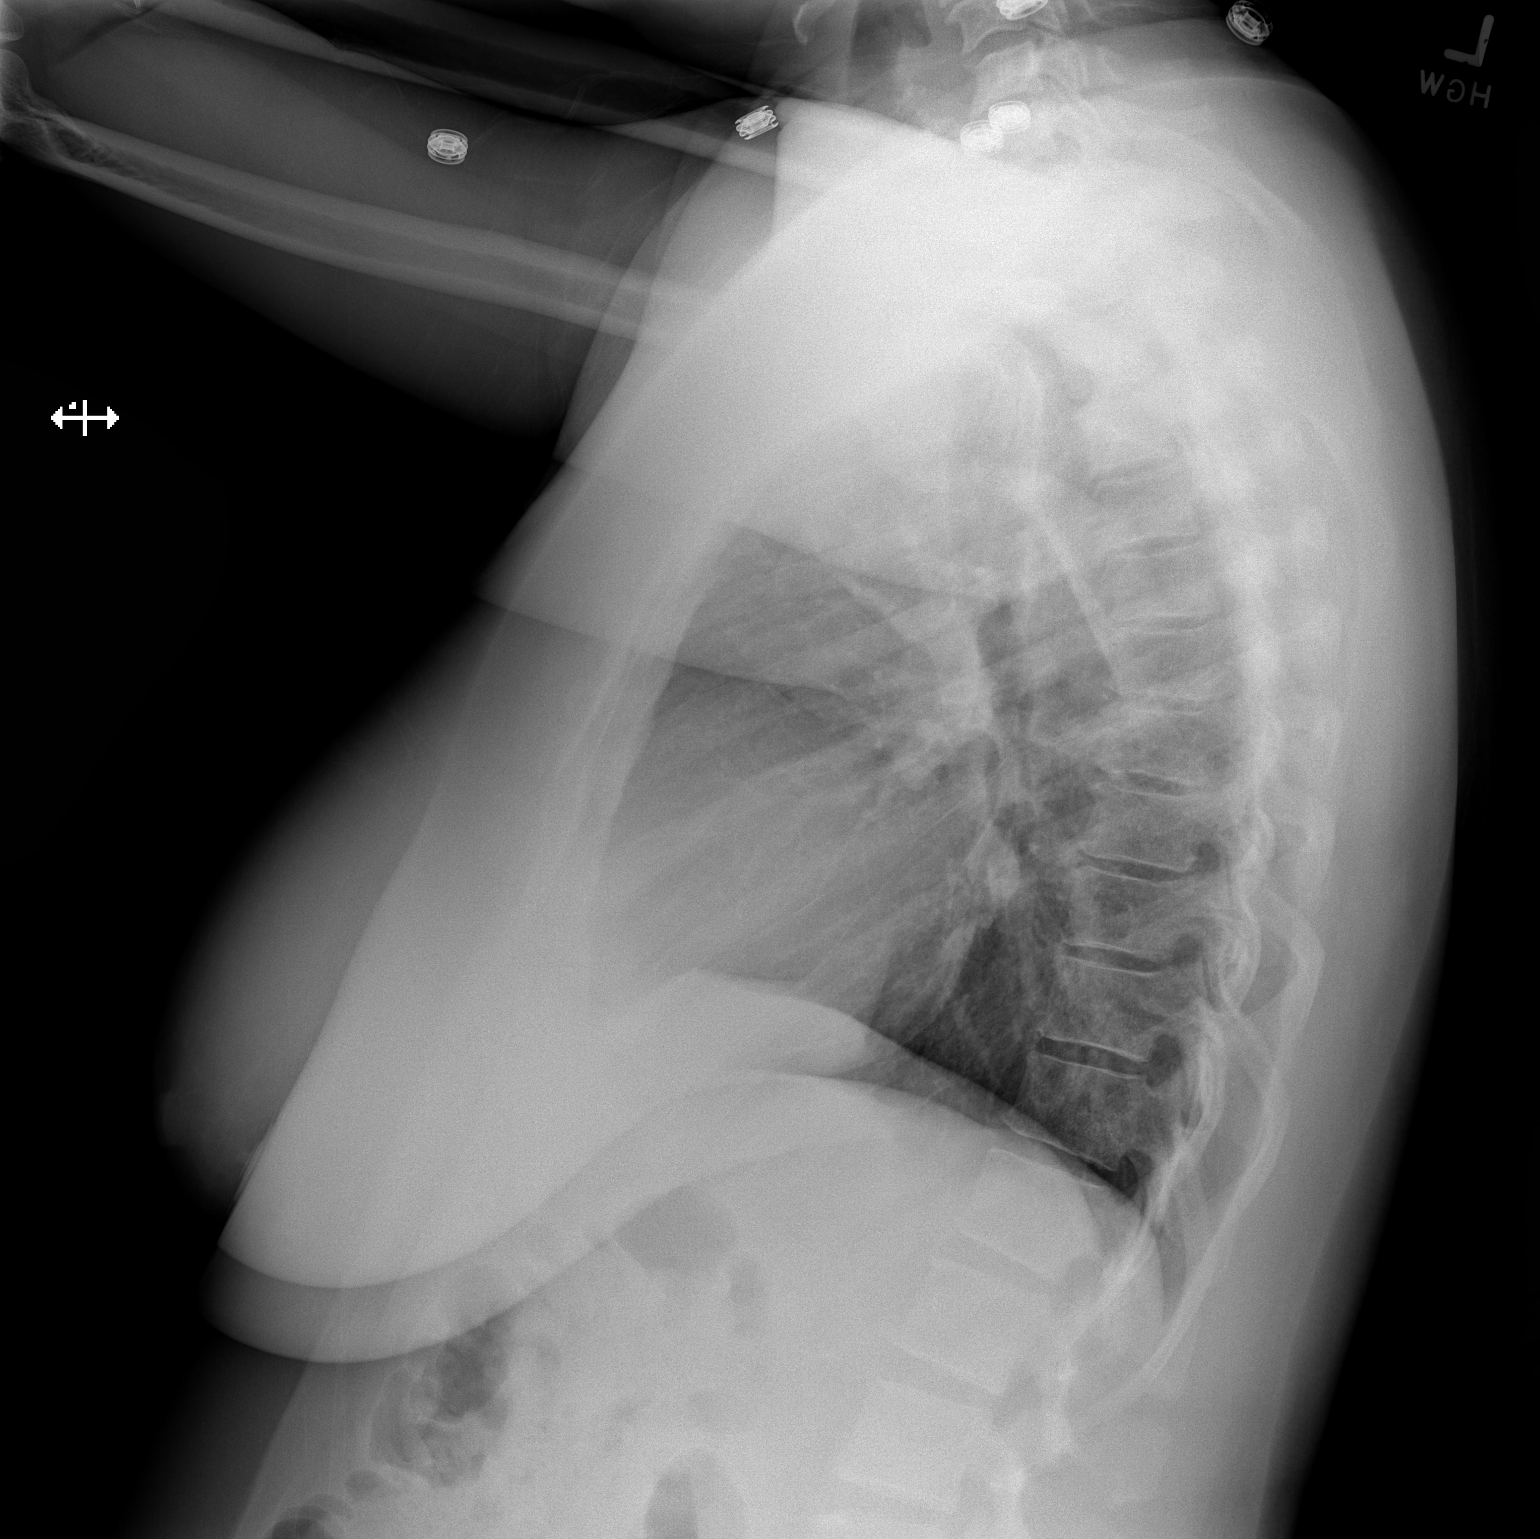

[2 of 2 positions shown; findings below may reference images not displayed]

FINDINGS: Upper normal but stable heart size. Vascular pattern normal. No
infiltrate or effusion. Bony thorax intact.
IMPRESSION: No active cardiopulmonary disease.

## 2016-03-25 ENCOUNTER — Ambulatory Visit (INDEPENDENT_AMBULATORY_CARE_PROVIDER_SITE_OTHER): Payer: BLUE CROSS/BLUE SHIELD | Admitting: Neurology

## 2016-03-25 ENCOUNTER — Encounter: Payer: Self-pay | Admitting: Neurology

## 2016-03-25 VITALS — BP 128/88 | HR 60 | Resp 20 | Ht 62.0 in | Wt 190.0 lb

## 2016-03-25 DIAGNOSIS — G4733 Obstructive sleep apnea (adult) (pediatric): Secondary | ICD-10-CM

## 2016-03-25 DIAGNOSIS — Z9989 Dependence on other enabling machines and devices: Principal | ICD-10-CM

## 2016-03-25 NOTE — Progress Notes (Signed)
[facility]  Provider:  Larey Seat, M D  Referring Provider: Primary Care Physician:  Bartholome Bill, MD  Chief Complaint  Patient presents with  . Follow-up    using [facility], cpap going well    HPI:  Laurie Perry is a 50 y.o. female  Is seen here as a  revisit  from Dr. Luciana Axe  for followup on sleep apnea.  Laurie Perry is originally from [place] was first referred to [place] by Dr. Ivar Bury  in November 2012. At the time she did not feel excessively daytime sleepy but her husband had told her that she was snoring and sometimes not breathing regularly at night .  She endorsed vivid dreams and nocturia. She was working full time and caring for 2 young children and attributed her fatigue to that.  A polysomnography was obtained in November 2012 which documented an overall AHI of 22.6 and a REM AHI of 52.9 her supine AHI was 48.4Her history was positive for allergic bronchitis, some sinusitis,  baseline tachypnea of unknown origin, and asthma. Later she also developed migrainous headaches . The  Epworth score was only  3 out of 24 points and the Beck Depression Inventory at one point.  At the time of her PSG , her body mass index was 32.4 and her neck circumference 15 inches she was not on medication at the time of her sleep study. The patient returned to be titrated to CPAP 10 cm water was recommended and the AHI at the setting was 1.5 per hour she had no PLMS , a normal sleep efficiency,  normal sleep architecture and normal EKG. Laurie Perry.   brought her CPAP machine to today's visit, dated 03-17-14, with a residual download AHI of 0.3, an average daily time of CPAP he was off 6 hours and 25 minutes and very little air leak and a set pressure of 10 cm without EPR. The patient has a 87% compliance for over 4 hours of use. [name] would like to discontinue CPAP use, but her husband has already objected, he cannot sleep when she snores and position has no effect on her  snoring and apnea. Her BMI has remained high.    Interval history from 03/25/2016, I have the pleasure of seeing Laurie Perry after a two-year interval. Her compliance to CPAP has been excellent over the last 30 days she has a compliance of 93%, average user time 6 hours 5 minutes, set pressure 10 cm water without EPR, residual AHI 0.8. There is a high air leak noted however. She states that sometimes she wakes up in the middle of the night and the nasal pillow has slept however it is not the cause of her waking up and it doesn't bother her,  Her sleep habits have  changed, her bedtime is usually around midnight she will sleep through for the next 8 hours and rises at 7 AM. No nocturia, but her husband noted breakthrough snoring when the CPAP nasal pillow has been dislodged.     Review of Systems: Out of a complete 14 system review, the patient complains of only the following symptoms, and all other reviewed systems are negative.  Epworth sleepiness score in 2017 endorsed at 4 points and fatigue severity score endorsed at 9 points.   2015 : Fatigue severity score was endorsed at 11 points, Epworth sleepiness score at one point.  2015 : These are very good results 87% compliance with CPAP he was with an average  daily time of use of 6 hours 25 minutes and residual AHI of 0.3.  We will try to change to a nasal mask. IV in 12 months with CPAP.   Social History   Social History  . Marital status: Married    Spouse name: Kodjo  . Number of children: 1  . Years of education: college   Occupational History  .  [employer]    [employer]   Social History Main Topics  . Smoking status: Never Smoker  . Smokeless tobacco: Never Used  . Alcohol use 0.5 oz/week    1 drink(s) per week     Comment: occasional  . Drug use: No  . Sexual activity: Not on file   Other Topics Concern  . Not on file   Social History Narrative   Patient lives at home with her husband Laurie Perry) and her mother  and son.Patient has a college education. Patient works at [employer].   Right handed.   Caffeine- None.    Family History  Problem Relation Age of Onset  . Hypertension Mother   . Arthritis Mother   . High blood pressure Father     Past Medical History:  Diagnosis Date  . Bronchitis   . GERD (gastroesophageal reflux disease)   . Migraines   . Obesity   . OSA (obstructive sleep apnea)    severe  . Ovarian cyst   . Uterine fibroids in pregnancy, postpartum condition     Past Surgical History:  Procedure Laterality Date  . CESAREAN SECTION     2012    Current Outpatient Prescriptions  Medication Sig Dispense Refill  . labetalol (NORMODYNE) 300 MG tablet Take 300 mg by mouth 2 (two) times daily.    . Multiple Vitamin (MULTIVITAMIN WITH MINERALS) TABS tablet Take 1 tablet by mouth. As needed     No current facility-administered medications for this visit.     Allergies as of 03/25/2016 - Review Complete 03/25/2016  Allergen Reaction Noted  . Other  07/16/2012  . Tramadol Itching 05/29/2014    Vitals: BP 128/88   Pulse 60   Resp 20   Ht 5\' 2"  (1.575 m)   Wt 190 lb (86.2 kg)   BMI 34.75 kg/m  Last Weight:  Wt Readings from Last 1 Encounters:  03/25/16 190 lb (86.2 kg)   Last Height:   Ht Readings from Last 1 Encounters:  03/25/16 5\' 2"  (1.575 m)    Physical exam:  General: The patient is awake, alert and appears not in acute distress. The patient is well groomed. Head: Normocephalic, atraumatic. Neck is supple. Mallampati 2  neck circumference: 15, 5 Mild  retrognathia , no nasal deviation,  Seasonal rhinitis.  Cardiovascular:  Regular rate and rhythm without  murmurs or carotid bruit, and without distended neck veins. Respiratory: Lungs are clear to auscultation. Skin:  Without evidence of edema, or rash Trunk: BMI is elevated and patient  has normal posture.  Neurologic exam : The patient is awake and alert, oriented to place and time.   Memory subjective   described as intact. There is a normal attention span & concentration ability.  Speech is fluent with dysarthria, dysphonia . Mood and affect are appropriate.  Cranial nerves: Pupils are equal and briskly reactive to light. The patient has cloudy, possibly neo-vascularized areas at the rim of both irises.  Funduscopic exam without  evidence of pallor or edema. Extraocular movements  in vertical and horizontal planes intact and without nystagmus. Visual  fields by finger perimetry are intact. Hearing to finger rub intact.  Facial sensation intact to fine touch.  Facial motor strength is symmetric and tongue and uvula move midline.   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  She has a high grade mallomati, large tongue and retrognathia.   Plan:  Treatment plan and additional workup : continue CPAP - documented 93% compliance for her .    DIET and exercise instructions. 2 days out of  5 diet explained, fasting intermittently . Berline Chough ) . Continue exercise , Continue CPAP use. Change to nasal mask or nasal pillow for comfort.     England Greb, MD  CC Dr Luciana Axe.

## 2017-04-13 ENCOUNTER — Ambulatory Visit: Payer: BLUE CROSS/BLUE SHIELD | Admitting: Adult Health

## 2017-04-14 ENCOUNTER — Encounter: Payer: Self-pay | Admitting: Adult Health

## 2017-05-18 DIAGNOSIS — Z0289 Encounter for other administrative examinations: Secondary | ICD-10-CM

## 2018-03-15 ENCOUNTER — Ambulatory Visit: Payer: BLUE CROSS/BLUE SHIELD | Admitting: Neurology

## 2018-03-15 ENCOUNTER — Encounter: Payer: Self-pay | Admitting: Neurology

## 2018-03-15 VITALS — BP 120/76 | HR 85 | Ht 62.5 in | Wt 195.0 lb

## 2018-03-15 DIAGNOSIS — Z9989 Dependence on other enabling machines and devices: Secondary | ICD-10-CM

## 2018-03-15 DIAGNOSIS — E669 Obesity, unspecified: Secondary | ICD-10-CM

## 2018-03-15 DIAGNOSIS — G4733 Obstructive sleep apnea (adult) (pediatric): Secondary | ICD-10-CM

## 2018-03-15 DIAGNOSIS — G4730 Sleep apnea, unspecified: Secondary | ICD-10-CM | POA: Diagnosis not present

## 2018-03-15 NOTE — Progress Notes (Signed)
[facility]  Provider:  Larey Perry, PerryD.   Referring Provider: Primary Care Physician:  Laurie Bill, MD  Chief Complaint  Patient presents with  . Follow-up    pt alone, rm 10. pt states that her CPAP working well except her cpap card keeps having error issues, DME was [facility]. pt is wanting someone more local and is wanting to switch.     HPI:   Laurie Perry is originally from [place] was first referred to [place] by Dr. Ivar Perry  in November 2012. At the time she did not feel excessively daytime sleepy but her husband had told her that she was snoring and sometimes not breathing regularly at night .  She endorsed vivid dreams and nocturia. She was working full time and caring for 2 young children and attributed her fatigue to that.  A polysomnography was obtained in November 2012 which documented an overall AHI of 22.6 and a REM AHI of 52.9 her supine AHI was 48.4Her history was positive for allergic bronchitis, some sinusitis,  baseline tachypnea of unknown origin, and asthma. Later she also developed migrainous headaches . The  Epworth score was only  3 out of 24 points and the Beck Depression Inventory at one point.  At the time of her PSG , her body mass index was 32.4 and her neck circumference 15 inches she was not on medication at the time of her sleep study. The patient returned to be titrated to CPAP 10 cm water was recommended and the AHI at the setting was 1.5 per hour she had no PLMS , a normal sleep efficiency,  normal sleep architecture and normal EKG. Mrs. Laurie Mages.   brought her CPAP machine to today's visit, dated 03-17-14, with a residual download AHI of 0.3, an average daily time of CPAP he was off 6 hours and 25 minutes and very little air leak and a set pressure of 10 cm without EPR. The patient has a 87% compliance for over 4 hours of use. [name] would like to discontinue CPAP use, but her husband has already objected, he cannot sleep when she  snores and position has no effect on her snoring and apnea. Her BMI has remained high.    Interval history from 03/25/2016, I have the pleasure of seeing Laurie Perry after a two-year interval. Her compliance to CPAP has been excellent over the last 30 days she has a compliance of 93%, average user time 6 hours 5 minutes, set pressure 10 cm water without EPR, residual AHI 0.8. There is a high air leak noted however. She states that sometimes she wakes up in the middle of the night and the nasal pillow has slept however it is not the cause of her waking up and it doesn't bother her,  Her sleep habits have  changed, her bedtime is usually around midnight she will sleep through for the next 8 hours and rises at 7 AM. No nocturia, but her husband noted breakthrough snoring when the CPAP nasal pillow has been dislodged.   03-15-2018 , RV 03-15-2018,  Laurie Perry is a 52 y.o. female, seen here as a revisit  from Dr. Luciana Perry  for follow up on sleep apnea.  Laurie Perry has recently visited her home in [place], she was traveling at the time of our last appointment with her.  She has been a compliant CPAP user up to mid July but then reports that her machine seems to switch itself off and seems to  be not recorded what she uses.  Her data from May June and the beginning of July looked excellent.  At the time she had a compliance for 86% which dropped to 53% in July and August.  Her residual AHI is under 1 for 90-day download it was at 0.6 and for the 30-day download at 0.3, she has no central apneas arising, she does have very high air leaks.  The machine is set at 10 cm water pressure without EPR she would like the machine to be evaluated, she received her machine through respite care which has been closed in [place] -They do still have an office in [facility]. She has been with them since 02-07-2013, and her machine was 50 years old by July. She is due for a new one.     Review of Systems: Out of a complete 14  system review, the patient complains of only the following symptoms, and all other reviewed systems are negative.  [facility] will take her machine over.  Epworth sleepiness score in 2019 endorsed at 2 points and fatigue severity score endorsed at  13 points.  Uses a  FFM.   2015 : Fatigue severity score was endorsed at 11 points, Epworth sleepiness score at one point.   Social History   Socioeconomic History  . Marital status: Married    Spouse name: Laurie Perry  . Number of children: 1  . Years of education: college  . Highest education level: Not on file  Occupational History    Employer: [employer]  Social Needs  . Financial resource strain: Not on file  . Food insecurity:    Worry: Not on file    Inability: Not on file  . Transportation needs:    Medical: Not on file    Non-medical: Not on file  Tobacco Use  . Smoking status: Never Smoker  . Smokeless tobacco: Never Used  Substance and Sexual Activity  . Alcohol use: Yes    Alcohol/week: 1.0 standard drinks    Types: 1 drink(s) per week    Comment: occasional  . Drug use: No  . Sexual activity: Not on file  Lifestyle  . Physical activity:    Days per week: Not on file    Minutes per session: Not on file  . Stress: Not on file  Relationships  . Social connections:    Talks on phone: Not on file    Gets together: Not on file    Attends religious service: Not on file    Active member of club or organization: Not on file    Attends meetings of clubs or organizations: Not on file    Relationship status: Not on file  . Intimate partner violence:    Fear of current or ex partner: Not on file    Emotionally abused: Not on file    Physically abused: Not on file    Forced sexual activity: Not on file  Other Topics Concern  . Not on file  Social History Narrative   Patient lives at home with her husband Laurie Perry) and her mother and son.Patient has a college education. Patient works at [employer].    Right handed.   Caffeine- None.    Family History  Problem Relation Age of Onset  . Hypertension Mother   . Arthritis Mother   . High blood pressure Father     Past Medical History:  Diagnosis Date  . Bronchitis   . GERD (gastroesophageal  reflux disease)   . Migraines   . Obesity   . OSA (obstructive sleep apnea)    severe  . Ovarian cyst   . Uterine fibroids in pregnancy, postpartum condition     Past Surgical History:  Procedure Laterality Date  . CESAREAN SECTION     2012    Current Outpatient Medications  Medication Sig Dispense Refill  . amLODipine (NORVASC) 5 MG tablet 5 mg daily.  3   No current facility-administered medications for this visit.     Allergies as of 03/15/2018 - Review Complete 03/15/2018  Allergen Reaction Noted  . Other  07/16/2012  . Tramadol Itching 05/29/2014    Vitals: BP 120/76   Pulse 85   Ht 5' 2.5" (1.588 m)   Wt 195 lb (88.5 kg)   BMI 35.10 kg/m  Last Weight:  Wt Readings from Last 1 Encounters:  03/15/18 195 lb (88.5 kg)   Last Height:   Ht Readings from Last 1 Encounters:  03/15/18 5' 2.5" (1.588 m)    Physical exam:  General: The patient is awake, alert and appears not in acute distress. The patient is well groomed. Head: Normocephalic, atraumatic. Neck is supple. Mallampati 3-   neck circumference: 15.25 " Mild  retrognathia , no nasal septum deviation, but has seasonal allergic rhinitis.  Cardiovascular:  Regular rate and rhythm without  murmurs or carotid bruit, and without distended neck veins. Respiratory: Lungs are clear to auscultation. Skin:  With evidence of ankle edema, not  rash Trunk: BMI is elevated.  Neurologic exam : The patient is awake and alert, oriented to place and time.   Memory subjective  described as intact.Speech is fluent with dysarthria. Mood and affect are appropriate.  Cranial nerves: No changes in taste and smell- Pupils are equal and briskly reactive to light. The patient has  cloudy, possibly neo-vascularized areas at the rim of both irises.  Funduscopic exam without  evidence of pallor or edema. Extraocular movements  in vertical and horizontal planes intact and without nystagmus.Hearing to finger rub intact.  Facial sensation intact to fine touch.  Facial motor strength is symmetric and tongue and uvula moved midline.   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  She has a high grade mallomati, large tongue and retrognathia.  She has high air leakage , and the FFM does not seal well.   Plan:  Treatment plan and additional workup : continue CPAP - her 2017  documented 93% compliance for her, but this year her machine " broke ' it switches it self off, she likes anew one and is due for one.  I order HST for her.  This will be an auto CPAP. Try a non FFM, perhaps. Dream wear ?  Rv after HST if positive.        Laurie Seat, MD  03-15-2018    CC Dr Laurie Perry.

## 2018-04-05 ENCOUNTER — Ambulatory Visit (INDEPENDENT_AMBULATORY_CARE_PROVIDER_SITE_OTHER): Payer: BLUE CROSS/BLUE SHIELD | Admitting: Neurology

## 2018-04-05 DIAGNOSIS — G4733 Obstructive sleep apnea (adult) (pediatric): Secondary | ICD-10-CM

## 2018-04-05 DIAGNOSIS — Z9989 Dependence on other enabling machines and devices: Secondary | ICD-10-CM

## 2018-04-05 DIAGNOSIS — G4730 Sleep apnea, unspecified: Secondary | ICD-10-CM

## 2018-04-05 DIAGNOSIS — E669 Obesity, unspecified: Secondary | ICD-10-CM

## 2018-04-09 NOTE — Procedures (Signed)
[facility] [address], [place] [address] NAME:  Laurie Perry                                                               DOB: [birth year] MEDICAL RECORD No: [id number]                                              DOS:  04/05/2018 Downloaded on 04-05-2018  REFERRING PHYSICIAN: Bartholome Bill, M.D. STUDY PERFORMED: Home Sleep Study on watch Pat   HISTORY: [name] is originally from [place]. She was first referred to [place] by Dr. Ivar Bury in November 2012. At the time she did not feel excessively daytime sleepy but her husband had told her that she was snoring and sometimes not breathing regularly at night.  She endorsed vivid dreams and nocturia. A polysomnography in November 2012 documented an overall AHI of 22.6 and a REM AHI of 52.9, her supine AHI was 48.4. The patient returned to be titrated to CPAP at 10 cm water. She continued to be highly compliant with CPAP use.  [name] travelled to [place] in July 2019 and her CPAP machine now sends error messages. She is interested in retesting her apnea, and to replace the CPAP machine that broke. BMI: 36.1, FSS 11, Epworth score 1 point.   STUDY RESULTS:  Total Recording Time: 7 h 24 minutes, valid sleep 5h and 24 min. Total Apnea/Hypopnea Index (AHI):  44.1 /h, RDI:  44.4 /h. Average Oxygen Saturation:  95 %; Lowest Oxygen Saturation: 80%  Total Time in Oxygen Saturation below 89 %:  3.1 minutes.  Average Heart Rate:  76 bpm (between 57 and 105 bpm). IMPRESSION: Severe Obstructive Sleep Apnea, with thunderous snoring (up to 64 dB), but not associated with prolonged hypoxemia, irregular heartbeats.   RECOMMENDATION: This degree of apnea still requires CPAP therapy. I will order auto titration CPAP for this patient, who is used to CPAP. The pressure window will be 5-15 cm water with 1 cm EPR, and her mask of choice.   I certify that I have reviewed the raw data recording prior to the issuance of this  report in accordance with the standards of the American Academy of Sleep Medicine (AASM). Larey Seat, M.D.       04-08-2018     Medical Director of [facility] at [facility], accredited by the AASM. Diplomat of the ABPN and ABSM.

## 2018-04-09 NOTE — Addendum Note (Signed)
Addended by: Larey Seat on: 04/09/2018 12:01 PM   Modules accepted: Orders

## 2018-04-12 ENCOUNTER — Telehealth: Payer: Self-pay | Admitting: Neurology

## 2018-04-12 NOTE — Telephone Encounter (Signed)
Patient calling to get home sleep test results.

## 2018-04-13 ENCOUNTER — Telehealth: Payer: Self-pay | Admitting: Neurology

## 2018-04-13 NOTE — Telephone Encounter (Signed)
Called and discussed the results

## 2018-04-13 NOTE — Telephone Encounter (Signed)
I called pt. I advised pt that Dr. Brett Fairy reviewed their sleep study results and found that pt has sleep apnea. Dr. Brett Fairy recommends that pt has severe sleep apnea. I reviewed PAP compliance expectations with the pt. Pt is agreeable to starting a CPAP. I advised pt that an order will be sent to a DME, [facility], and [facility] will call the pt within about one week after they file with the pt's insurance. [facility] will show the pt how to use the machine, fit for masks, and troubleshoot the CPAP if needed. A follow up appt was made for insurance purposes with Dr. Brett Fairy on Jul 19 2018 at 1:30 pm. Pt verbalized understanding to arrive 15 minutes early and bring their CPAP. A letter with all of this information in it will be mailed to the pt as a reminder. I verified with the pt that the address we have on file is correct. Pt verbalized understanding of results. Pt had no questions at this time but was encouraged to call back if questions arise.

## 2018-04-13 NOTE — Telephone Encounter (Signed)
-----   Message from Larey Seat, MD sent at 04/09/2018 12:01 PM EDT ----- IMPRESSION: Severe Obstructive Sleep Apnea, with thunderous  snoring (up to 64 dB), but not associated with prolonged  hypoxemia, irregular heartbeats.  RECOMMENDATION: This degree of apnea still requires CPAP therapy.  I will order auto titration CPAP for this patient, who is used to  CPAP. The pressure window will be 5-15 cm water with 1 cm EPR,  and her mask of choice.   Main risk factor for severe and REM accentuated sleep apnea is an elevated BMI.   Please send cc to Dr Luciana Axe.

## 2018-07-18 ENCOUNTER — Encounter: Payer: Self-pay | Admitting: Neurology

## 2018-07-19 ENCOUNTER — Encounter: Payer: Self-pay | Admitting: Neurology

## 2018-07-19 ENCOUNTER — Ambulatory Visit: Payer: BLUE CROSS/BLUE SHIELD | Admitting: Neurology

## 2018-07-19 VITALS — BP 152/99 | HR 72 | Ht 62.0 in | Wt 192.0 lb

## 2018-07-19 DIAGNOSIS — G4733 Obstructive sleep apnea (adult) (pediatric): Secondary | ICD-10-CM | POA: Diagnosis not present

## 2018-07-19 DIAGNOSIS — Z9989 Dependence on other enabling machines and devices: Secondary | ICD-10-CM

## 2018-07-19 NOTE — Patient Instructions (Signed)
Nasal Allergies Nasal allergies are a reaction to allergens in the air. Allergens are particles in the air that cause your body to have an allergic reaction. Nasal allergies are not passed from person to person (are not contagious). They cannot be cured, but they can be controlled. What are the causes? Seasonal nasal allergies (hay fever) are caused by pollen allergens that come from grasses, trees, and weeds. Year-round nasal allergies (perennial allergic rhinitis) are caused by allergens such as house dust mites, pet dander, and mold spores. What increases the risk? The following factors may make you more likely to develop this condition:  Having certain health conditions. These include: ? Other types of allergies, such as food allergies. ? Asthma. ? Eczema.  Having a close relative who has allergies or asthma.  Exposure to house dust, pollen, dander, or other allergens at home or at work.  Exposure to air pollution or secondhand smoke when you were a child. What are the signs or symptoms? Symptoms of this condition include:  Sneezing.  Runny nose or stuffy nose (congestion).  Watery (tearing) eyes.  Itchy eyes, nose, mouth, throat, skin, or other area.  Sore throat.  Headache.  Decreased sense of smell or taste.  Fatigue. This may occur if you have trouble sleeping due to allergies.  Swollen eyelids. How is this diagnosed? This condition is diagnosed with a medical history and physical exam. Allergy testing may be done to determine exactly what triggers your nasal allergies. How is this treated? There is no cure for nasal allergies. Treatment focuses on controlling your symptoms, and it may include:  Medicines that block allergy symptoms. These may include allergy shots, nasal sprays, and oral antihistamines.  Avoiding the allergen. Follow these instructions at home:  Avoid the allergen that is causing your symptoms, if possible.  Keep windows closed. If possible,  use air conditioning when pollen counts are high.  Do not use fans in your home.  Do not hang clothes outside to dry.  Wear sunglasses to keep pollen out of your eyes.  Wash your hands right away after you touch household pets.  Take over-the-counter and prescription medicines only as told by your health care provider.  Keep all follow-up visits as told by your health care provider. This is important. Contact a health care provider if:  You have a fever.  You develop a cough that does not go away (is persistent).  You start to wheeze.  Your symptoms do not improve with treatment.  You have thick nasal discharge.  You start to have nosebleeds. Get help right away if:  Your tongue or your lips are swollen.  You have trouble breathing.  You feel light-headed or you feel like you are going to faint.  You have cold sweats. This information is not intended to replace advice given to you by your health care provider. Make sure you discuss any questions you have with your health care provider. Document Released: 07/14/2005 Document Revised: 12/17/2015 Document Reviewed: 01/24/2015 Elsevier Interactive Patient Education  2019 [employer].

## 2018-07-19 NOTE — Progress Notes (Signed)
[name]  Provider:  Larey Seat, M.D.   Referring Provider: Primary Care Physician:    Bartholome Bill, MD  Chief Complaint  Patient presents with  . Follow-up    pt alone, rm 10. pt here for follow up with CPAP visit. she never started a new machine.  DME [facility]     HPI: Mrs. Cassidy is originally from [place] was first referred to [place] by Dr. Ivar Bury in November 2012. At the time she did not feel excessively daytime sleepy but her husband had told her that she was snoring and sometimes not breathing regularly at night .  She endorsed vivid dreams and nocturia. She was working full time and caring for 2 young children and attributed her fatigue to that.  A polysomnography was obtained in November 2012 which documented an overall AHI of 22.6 and a REM AHI of 52.9 her supine AHI was 48.4Her history was positive for allergic bronchitis, some sinusitis,  baseline tachypnea of unknown origin, and asthma. Later she also developed migrainous headaches . The  Epworth score was only  3 out of 24 points and the Beck Depression Inventory at one point.  At the time of her PSG , her body mass index was 32.4 and her neck circumference 15 inches she was not on medication at the time of her sleep study. The patient returned to be titrated to CPAP 10 cm water was recommended and the AHI at the setting was 1.5 per hour she had no PLMS , a normal sleep efficiency,  normal sleep architecture and normal EKG. [name]   brought her CPAP machine to today's visit, dated 03-17-14, with a residual download AHI of 0.3, an average daily time of CPAP he was off 6 hours and 25 minutes and very little air leak and a set pressure of 10 cm without EPR. The patient has a 87% compliance for over 4 hours of use. [name] would like to discontinue CPAP use, but her husband has already objected, he cannot sleep when she snores and position has no effect on her snoring and apnea. Her BMI has remained  high.    Interval history from 03/25/2016, I have the pleasure of seeing Mrs. Landfair after a two-year interval. Her compliance to CPAP has been excellent over the last 30 days she has a compliance of 93%, average user time 6 hours 5 minutes, set pressure 10 cm water without EPR, residual AHI 0.8. There is a high air leak noted however. She states that sometimes she wakes up in the middle of the night and the nasal pillow has slept however it is not the cause of her waking up and it doesn't bother her,  Her sleep habits have  changed, her bedtime is usually around midnight she will sleep through for the next 8 hours and rises at 7 AM. No nocturia, but her husband noted breakthrough snoring when the CPAP nasal pillow has been dislodged.   03-15-2018 , RV 03-15-2018,  Kailana Benninger is a 52 y.o. female, seen here as a revisit  from Dr. Luciana Axe  for follow up on sleep apnea.  Mrs. Staron has recently visited her home in [place], she was traveling at the time of our last appointment with her.  She has been a compliant CPAP user up to mid July but then reports that her machine seems to switch itself off and seems to be not recorded what she uses.  Her data from May June and  the beginning of July looked excellent.  At the time she had a compliance for 86% which dropped to 53% in July and August.  Her residual AHI is under 1 for 90-day download it was at 0.6 and for the 30-day download at 0.3, she has no central apneas arising, she does have very high air leaks.  The machine is set at 10 cm water pressure without EPR she would like the machine to be evaluated, she received her machine through resp[facility] care which has been closed in [place] -They do still have an office in [facility]. She has been with them since 02-07-2013, and her machine was 50 years old by July. She is due for a new one.   Interval history from 19 July 2018, I have the pleasure of meeting today with anti-Mensa, I mean by 52 year old lady from  [place], She underwent a re-study by home sleep test on 05 April 2018 which documented severe obstructive sleep apnea.  The watch pat documented an AHI of 41.1, RDI of 41.4 only 3 minutes of oxygen desaturation and a variable heart rate between 57 and 105 bpm.  I would have liked for her to have a new auto titration capable CPAP but apparently she has a very high co-pay and could not afford the new machine now she [facility] her current CP[facility] which is [facility] S[facility] to 10 cmH2O and it is working excellently she does not use an ex by vibratory pressure relief, her AHI is 0.6 she has used the machine 29 out of 30 days 90% of the time with an average daily use at time of 6 hours and 58 minutes.  Airleak data moderate at 13.2 l/ min.  .    Review of Systems: Out of a complete 14 system review, the patient complains of only the following symptoms, and all other reviewed systems are negative.  [facility] did not replace the machine.  Epworth sleepiness score in 2019 endorsed at 1point and fatigue severity score endorsed at  10 points.  Uses a FFM. S 9 autoset. Due, whenever she is able to afford a new machine.   2015 : Fatigue severity score was endorsed at 11 points, Epworth sleepiness score at one point.   Social History   Socioeconomic History  . Marital status: Married    Spouse name: Kodjo  . Number of children: 1  . Years of education: college  . Highest education level: Not on file  Occupational History    Employer: [place]: [employer]  Social Needs  . Financial resource strain: Not on file  . Food insecurity:    Worry: Not on file    Inability: Not on file  . Transportation needs:    Medical: Not on file    Non-medical: Not on file  Tobacco Use  . Smoking status: Never Smoker  . Smokeless tobacco: Never Used  Substance and Sexual Activity  . Alcohol use: Yes    Alcohol/week: 1.0 standard drinks    Types: 1 drink(s) per week     Comment: occasional  . Drug use: No  . Sexual activity: Not on file  Lifestyle  . Physical activity:    Days per week: Not on file    Minutes per session: Not on file  . Stress: Not on file  Relationships  . Social connections:    Talks on phone: Not on file    Gets together:  Not on file    Attends religious service: Not on file    Active member of club or organization: Not on file    Attends meetings of clubs or organizations: Not on file    Relationship status: Not on file  . Intimate partner violence:    Fear of current or ex partner: Not on file    Emotionally abused: Not on file    Physically abused: Not on file    Forced sexual activity: Not on file  Other Topics Concern  . Not on file  Social History Narrative   Patient lives at home with her husband Lynelle Smoke) and her mother and son.Patient has a college education. Patient works at [employer].   Right handed.   Caffeine- None.    Family History  Problem Relation Age of Onset  . Hypertension Mother   . Arthritis Mother   . High blood pressure Father     Past Medical History:  Diagnosis Date  . Bronchitis   . GERD (gastroesophageal reflux disease)   . Migraines   . Obesity   . OSA (obstructive sleep apnea)    severe  . Ovarian cyst   . Uterine fibroids in pregnancy, postpartum condition     Past Surgical History:  Procedure Laterality Date  . CESAREAN SECTION     2012    Current Outpatient Medications  Medication Sig Dispense Refill  . amLODipine (NORVASC) 5 MG tablet 5 mg daily.  3   No current facility-administered medications for this visit.     Allergies as of 07/19/2018 - Review Complete 07/19/2018  Allergen Reaction Noted  . Other  07/16/2012  . Tramadol Itching 05/29/2014    Vitals: BP (!) 152/99   Pulse 72   Ht 5\' 2"  (1.575 m)   Wt 192 lb (87.1 kg)   BMI 35.12 kg/m  Last Weight:  Wt Readings from Last 1 Encounters:  07/19/18 192 lb (87.1 kg)   Last Height:   Ht Readings from Last 1  Encounters:  07/19/18 5\' 2"  (1.575 m)    Physical exam:  General: The patient is awake, alert and appears not in acute distress. The patient is well groomed. She is fluently speaking, cooperative and interested.  Neck is supple. Mallampati 3-   neck circumference: 15.25 " Mild  retrognathia , nasal airflow is congested. Cardiovascular:  Regular rate and rhythm without  murmurs or carotid bruit, and without distended neck veins. Respiratory: Lungs are clear to auscultation. Skin:  With evidence of ankle edema, not  rash Neurologic exam :The patient is awake and alert, oriented to place and time.  Memory subjective  described as intact.Speech is fluent with dysarthria. Mood and affect are appropriate.  Cranial nerves: No changes in taste and smell- Pupils are equal and briskly reactive to light. Hearing to finger rub intact.  Facial sensation intact to fine touch.  Facial motor strength is symmetric and tongue and uvula moved midline. normal coordination, DTRs are symmetric, walks without cane/   Assessment:  After physical and neurologic examination, review of  testing and pre-existing records, assessment is that of  1) severe, likely still REM dependent OSA,  Risk factor is retrognathia, nasal congestion and being overweight . She has a high grade mallomati, large tongue and retrognathia.  She has high air leakage , and the FFM does not seal well.   Plan:  Treatment plan and additional workup : continue CPAP - at 10 cm water , no EPR and use of a  FFM/  Yearly RV with NP alternating with me.   Larey Seat, MD  03-15-2018    CC Dr Luciana Axe.

## 2019-08-22 ENCOUNTER — Other Ambulatory Visit: Payer: Self-pay

## 2019-08-22 ENCOUNTER — Encounter: Payer: Self-pay | Admitting: Adult Health

## 2019-08-22 ENCOUNTER — Ambulatory Visit: Payer: BLUE CROSS/BLUE SHIELD | Admitting: Adult Health

## 2019-08-22 VITALS — BP 124/80 | HR 81 | Temp 96.6°F | Ht 62.0 in | Wt 189.8 lb

## 2019-08-22 DIAGNOSIS — Z9989 Dependence on other enabling machines and devices: Secondary | ICD-10-CM

## 2019-08-22 DIAGNOSIS — G4733 Obstructive sleep apnea (adult) (pediatric): Secondary | ICD-10-CM | POA: Diagnosis not present

## 2019-08-22 NOTE — Progress Notes (Signed)
PATIENT: Laurie Perry DOB: [birth year]  REASON FOR VISIT: follow up HISTORY FROM: patient  HISTORY OF PRESENT ILLNESS: Today 08/22/19:  Ms. Laurie Perry is a 54 year old female with a history of obstructive sleep apnea on CPAP.  She returns today for follow-up.  Her download indicates that she used her machine 27 out of 30 days for compliance of 90%.  She is averaging greater than 4 hours each night.  On average she uses her machine 7 hours and 45 minutes.  Her residual AHI is 0.2 on 10 cm of water.  Her leak in the 95th percentile is 6.7 L/min.  She states that she no longer snores.  She has not noticed much of a change during the day however she does note that she was not having any daytime symptoms originally.  She returns today for follow-up.  HISTORY 07/19/18 (Copied from Dr.Dohmeier's note)  Mrs. Laurie Perry is originally from [place] was first referred to [place] by Dr. Ivar Bury in November 2012. At the time she did not feel excessively daytime sleepy but her husband had told her that she was snoring and sometimes not breathing regularly at night .  She endorsed vivid dreams and nocturia. She was working full time and caring for 2 young children and attributed her fatigue to that.  A polysomnography was obtained in November 2012 which documented an overall AHI of 22.6 and a REM AHI of 52.9 her supine AHI was 48.4Her history was positive for allergic bronchitis, some sinusitis,  baseline tachypnea of unknown origin, and asthma. Later she also developed migrainous headaches . The  Epworth score was only  3 out of 24 points and the Beck Depression Inventory at one point.  At the time of her PSG , her body mass index was 32.4 and her neck circumference 15 inches she was not on medication at the time of her sleep study. The patient returned to be titrated to CPAP 10 cm water was recommended and the AHI at the setting was 1.5 per hour she had no PLMS , a normal sleep efficiency,  normal sleep architecture and  normal EKG. Mrs. Laurie Perry.   brought her CPAP machine to today's visit, dated 03-17-14, with a residual download AHI of 0.3, an average daily time of CPAP he was off 6 hours and 25 minutes and very little air leak and a set pressure of 10 cm without EPR. The patient has a 87% compliance for over 4 hours of use. [name] would like to discontinue CPAP use, but her husband has already objected, he cannot sleep when she snores and position has no effect on her snoring and apnea. Her BMI has remained high.  REVIEW OF SYSTEMS: Out of a complete 14 system review of symptoms, the patient complains only of the following symptoms, and all other reviewed systems are negative.  Epworth sleepiness score 1, fatigue severity score 9  ALLERGIES: Allergies  Allergen Reactions  . Other     Tree nuts by allergy testing  . Tramadol Itching    HOME MEDICATIONS: Outpatient Medications Prior to Visit  Medication Sig Dispense Refill  . amLODipine (NORVASC) 5 MG tablet 5 mg daily.  3   No facility-administered medications prior to visit.    PAST MEDICAL HISTORY: Past Medical History:  Diagnosis Date  . Bronchitis   . GERD (gastroesophageal reflux disease)   . Migraines   . Obesity   . OSA (obstructive sleep apnea)    severe  . Ovarian cyst   .  Uterine fibroids in pregnancy, postpartum condition     PAST SURGICAL HISTORY: Past Surgical History:  Procedure Laterality Date  . CESAREAN SECTION     2012    FAMILY HISTORY: Family History  Problem Relation Age of Onset  . Hypertension Mother   . Arthritis Mother   . High blood pressure Father     SOCIAL HISTORY: Social History   Socioeconomic History  . Marital status: Married    Spouse name: Kodjo  . Number of children: 1  . Years of education: college  . Highest education level: Not on file  Occupational History    Employer: [place]: [employer]  Tobacco Use  . Smoking status: Never Smoker  . Smokeless tobacco:  Never Used  Substance and Sexual Activity  . Alcohol use: Yes    Alcohol/week: 1.0 standard drinks    Types: 1 drink(s) per week    Comment: occasional  . Drug use: No  . Sexual activity: Not on file  Other Topics Concern  . Not on file  Social History Narrative   Patient lives at home with her husband Lynelle Smoke) and her mother and son.Patient has a college education. Patient works at [employer].   Right handed.   Caffeine- None.   Social Determinants of Health   Financial Resource Strain:   . Difficulty of Paying Living Expenses: Not on file  Food Insecurity:   . Worried About Charity fundraiser in the Last Year: Not on file  . Ran Out of Food in the Last Year: Not on file  Transportation Needs:   . Lack of Transportation (Medical): Not on file  . Lack of Transportation (Non-Medical): Not on file  Physical Activity:   . Days of Exercise per Week: Not on file  . Minutes of Exercise per Session: Not on file  Stress:   . Feeling of Stress : Not on file  Social Connections:   . Frequency of Communication with Friends and Family: Not on file  . Frequency of Social Gatherings with Friends and Family: Not on file  . Attends Religious Services: Not on file  . Active Member of Clubs or Organizations: Not on file  . Attends Archivist Meetings: Not on file  . Marital Status: Not on file  Intimate Partner Violence:   . Fear of Current or Ex-Partner: Not on file  . Emotionally Abused: Not on file  . Physically Abused: Not on file  . Sexually Abused: Not on file      PHYSICAL EXAM  Vitals:   08/22/19 1032  BP: 124/80  Pulse: 81  Temp: (!) 96.6 F (35.9 C)  Weight: 189 lb 12.8 oz (86.1 kg)  Height: 5\' 2"  (1.575 m)   Body mass index is 34.71 kg/m.  Generalized: Well developed, in no acute distress  Chest: Lungs clear to auscultation bilaterally  Neurological examination  Mentation: Alert oriented to time, place, history taking. Follows all commands speech and  language fluent Cranial nerve II-XII: Extraocular movements were full, visual field were full on confrontational test Head turning and shoulder shrug  were normal and symmetric. Motor: The motor testing reveals 5 over 5 strength of all 4 extremities. Good symmetric motor tone is noted throughout.  Sensory: Sensory testing is intact to soft touch on all 4 extremities. No evidence of extinction is noted.  Gait and station: Gait is normal.   DIAGNOSTIC DATA (LABS, IMAGING, TESTING) - I reviewed patient records, labs, notes, testing and  imaging myself where available.  Lab Results  Component Value Date   WBC 5.7 10/01/2014   HGB 11.6 (L) 10/01/2014   HCT 34.5 (L) 10/01/2014   MCV 91.3 10/01/2014   PLT 265 10/01/2014      Component Value Date/Time   NA 135 10/01/2014 1250   K 4.0 10/01/2014 1250   CL 101 10/01/2014 1250   CO2 30 10/01/2014 1250   GLUCOSE 87 10/01/2014 1250   BUN 11 10/01/2014 1250   CREATININE 0.84 10/01/2014 1250   CALCIUM 9.1 10/01/2014 1250   PROT 7.2 03/04/2011 1006   ALBUMIN 3.6 03/04/2011 1006   AST 220 (H) 03/04/2011 1006   ALT 118 (H) 03/04/2011 1006   ALKPHOS 117 03/04/2011 1006   BILITOT 0.8 03/04/2011 1006   GFRNONAA 82 (L) 10/01/2014 1250   GFRAA >90 10/01/2014 1250      ASSESSMENT AND PLAN 54 y.o. year old female  has a past medical history of Bronchitis, GERD (gastroesophageal reflux disease), Migraines, Obesity, OSA (obstructive sleep apnea), Ovarian cyst, and Uterine fibroids in pregnancy, postpartum condition. here with:  1. Obstructive sleep apnea on CPAP  Patient CPAP download shows excellent compliance and good treatment of her apnea.  She is encouraged to continue using CPAP nightly and greater than 4 hours each night.  She is advised that if her symptoms worsen or she develops new symptoms she should let us know.  She will follow-up in 1 year or sooner if needed   I spent 15 minutes with the patient. 50% of this time was spent reviewing  CPAP download   Ward Givens, MSN, NP-C 08/22/2019, 10:35 AM [facility] [address], [place], [place] [address] [phone]

## 2019-08-22 NOTE — Patient Instructions (Signed)

## 2020-09-17 ENCOUNTER — Ambulatory Visit: Payer: BC Managed Care – PPO | Admitting: Adult Health

## 2024-07-04 ENCOUNTER — Ambulatory Visit (HOSPITAL_COMMUNITY): Admission: EM | Admit: 2024-07-04 | Discharge: 2024-07-04 | Disposition: A | Discharge status: Home / Self Care

## 2024-07-04 ENCOUNTER — Encounter (HOSPITAL_COMMUNITY): Payer: Self-pay

## 2024-07-04 ENCOUNTER — Ambulatory Visit (HOSPITAL_COMMUNITY): Payer: Self-pay

## 2024-07-04 DIAGNOSIS — G44209 Tension-type headache, unspecified, not intractable: Secondary | ICD-10-CM | POA: Diagnosis not present

## 2024-07-04 LAB — C-REACTIVE PROTEIN: CRP: 0.6 mg/dL (ref <1.0)

## 2024-07-04 LAB — SEDIMENTATION RATE: Sed Rate: 6 mm/h (ref 0–22)

## 2024-07-04 MED ORDER — KETOROLAC TROMETHAMINE 30 MG/ML IJ SOLN
30.0000 mg | Freq: Once | INTRAMUSCULAR | Status: AC
  Administered 2024-07-04: 30 mg via INTRAMUSCULAR

## 2024-07-04 MED ORDER — KETOROLAC TROMETHAMINE 30 MG/ML IJ SOLN
INTRAMUSCULAR | Status: AC
  Filled 2024-07-04: qty 1

## 2024-07-04 MED ORDER — PREDNISONE 20 MG PO TABS: 40.0000 mg | ORAL_TABLET | Freq: Every day | ORAL | 0 refills | Status: AC

## 2024-07-04 NOTE — Discharge Instructions (Signed)
-  You probably have a tension headache, which can be caused by stress. -I am a little worried about something called temporal arteritis.  This is when the artery in your forehead becomes inflamed.  It is very rare, but very serious.  If untreated, it can cause vision loss. -We are starting you on a medication called prednisone .  Take this until we get lab results back.  If the lab results are normal, you will be able to stop the medication.  If the lab results are positive, you will need to see an ear nose and throat doctor immediately for further management. -Prednisone , 2 pills taken at the same time for 7 days in a row.  Try taking this earlier in the day as it can give you energy. Avoid NSAIDs like ibuprofen  and alleve while taking this medication as they can increase your risk of stomach upset and even GI bleeding when in combination with a steroid. You can continue tylenol  (acetaminophen ) up to 1000mg  3x daily. -We administered a shot of Toradol  today.  This is an NSAID pain medication, similar to ibuprofen . -Because we administered the Toradol  today, please avoid NSAIDs like ibuprofen  or Advil  for the rest of the day. -You can still take Tylenol  1000mg  up to 3x daily

## 2024-07-04 NOTE — ED Triage Notes (Signed)
 Patient here today with c/o a migraine that started last night. Patient states that she has a h/o migraine years ago. Patient denies N&V and denies sensitivity to light. Patient has taken ASA and a tension headache medicine with no relief.

## 2024-07-04 NOTE — ED Provider Notes (Addendum)
 [facility]    CSN: [id number] Arrival date & time: 07/04/24  0941      History   Chief Complaint Chief Complaint  Patient presents with   Migraine    HPI Laurie Perry is a 58 y.o. female presenting with headache.  History of migraine headaches, though she states that this feels different.  She describes a throbbing pain behind the left temple, radiating to the scalp, and down the left side of her neck.  She notes jaw and temple pain with eating.  This is not associated with dizziness, lightheadedness, vision changes, weakness.  She also denies nausea, vomiting, photophobia.  The headache feels different than her typical migraine.  Denies worst headache of life.  She took an aspirin last night with no relief.  HPI  Past Medical History:  Diagnosis Date   Bronchitis    GERD (gastroesophageal reflux disease)    Migraines    Obesity    OSA (obstructive sleep apnea)    severe   Ovarian cyst    Uterine fibroids in pregnancy, postpartum condition     Patient Active Problem List   Diagnosis Date Noted   Strain of lumbar paraspinal muscle 05/29/2014   Lower extremity tendinopathy 10/18/2013   Trigger point of shoulder region 05/09/2013   Obesity (BMI 30-39.9) 02/25/2013   Sleep apnea with use of continuous positive airway pressure (CPAP) 02/25/2013   Chronic headache 07/31/2012   Musculoskeletal chest pain 07/31/2012   Annual physical exam 07/16/2012   Pelvic pain 07/16/2012   Pre-eclampsia, delivered 02/01/2011    Past Surgical History:  Procedure Laterality Date   CESAREAN SECTION     2012    OB History     Gravida  2   Para  1   Term      Preterm  1   AB      Living  1      SAB      IAB      Ectopic      Multiple      Live Births               Home Medications    Prior to Admission medications   Medication Sig Start Date End Date Taking? Authorizing Provider  cholecalciferol (VITAMIN D3) 25 MCG (1000 UNIT) tablet Take  1,000 Units by mouth daily. 10/07/19  Yes [provider]  magnesium  oxide (MAG-OX) 400 MG tablet Take 400 mg by mouth daily. 10/07/19  Yes [provider]  Omega-3 Fatty Acids (OMEGA III EPA+DHA) 1000 MG CAPS Take by mouth. 10/07/19  Yes [provider]  predniSONE  (DELTASONE ) 20 MG tablet Take 2 tablets (40 mg total) by mouth daily for 7 days. Take with breakfast or lunch. Avoid NSAIDs (ibuprofen , etc) while taking this medication. 07/04/24 07/11/24 Yes Emylie Amster E, PA-C  amLODipine (NORVASC) 5 MG tablet 5 mg daily. 03/08/18   [provider]    Family History Family History  Problem Relation Age of Onset   Hypertension Mother    Arthritis Mother    High blood pressure Father     Social History Social History   Tobacco Use   Smoking status: Never   Smokeless tobacco: Never  Substance Use Topics   Alcohol use: Yes    Alcohol/week: 1.0 standard drink of alcohol    Types: 1 drink(s) per week    Comment: occasional   Drug use: No     Allergies   Other and Tramadol   Review of Systems Review of Systems  Neurological:  Positive for headaches.     Physical Exam Triage Vital Signs ED Triage Vitals  Encounter Vitals Group     BP      Girls Systolic BP Percentile      Girls Diastolic BP Percentile      Boys Systolic BP Percentile      Boys Diastolic BP Percentile      Pulse      Resp      Temp      Temp src      SpO2      Weight      Height      Head Circumference      Peak Flow      Pain Score      Pain Loc      Pain Education      Exclude from Growth Chart    No data found.  Updated Vital Signs BP (!) 143/92 (BP Location: Left Arm)   Pulse 72   Temp 98.1 F (36.7 C) (Oral)   Resp 16   LMP 04/11/2024 (Approximate)   SpO2 96%   Visual Acuity Right Eye Distance:   Left Eye Distance:   Bilateral Distance:    Right Eye Near:   Left Eye Near:    Bilateral Near:     Physical Exam Vitals reviewed.   Constitutional:      General: She is not in acute distress.    Appearance: Normal appearance. She is not ill-appearing.  HENT:     Head: Normocephalic and atraumatic.  Pulmonary:     Effort: Pulmonary effort is normal.  Neurological:     General: No focal deficit present.     Mental Status: She is alert and oriented to person, place, and time.     Comments: There is not reproducible left temple or left scalp tenderness to palpation.  Her left cervical paraspinous muscles are tense, and there is pain with bringing the chin to the chest.  PERRLA, EOMI. Visual acuity grossly intact. Strength intact upper and lower extremities. Gait intact.   Psychiatric:        Mood and Affect: Mood normal.        Behavior: Behavior normal.        Thought Content: Thought content normal.        Judgment: Judgment normal.      UC Treatments / Results  Labs (all labs ordered are listed, but only abnormal results are displayed) Labs Reviewed  SEDIMENTATION RATE  C-REACTIVE PROTEIN    EKG   Radiology No results found.  Procedures Procedures (including critical care time)  Medications Ordered in UC Medications  ketorolac  (TORADOL ) 30 MG/ML injection 30 mg (30 mg Intramuscular Given 07/04/24 1016)    Initial Impression / Assessment and Plan / UC Course  I have reviewed the triage vital signs and the nursing notes.  Pertinent labs & imaging results that were available during my care of the patient were reviewed by me and considered in my medical decision making (see chart for details).     Patient is a pleasant 58 y.o. female presenting with headache. The patient is afebrile and nontachycardic.  Antipyretic has not been administered today. Neuro exam reassuring. No vision changes.   She does not have a history of kidney disease.  GFR and creatinine WNL on 09/2023 CMP.  Last home NSAID was taken greater than 12 hours ago. IM Toradol  administered during visit.  Following  20 minutes,  improvement but not resolution in pain.  DDx is tension type headache versus temporal arteritis.  We are checking an ESR and CRP, and I am starting her on prednisone .  If the ESR and CRP are WNL, she understands that she can stop the prednisone ; but if the levels are high, she would need an urgent follow-up with ENT / surgery for temporal arterial biopsy.  Also discussed w Dr Vonna   Level 4 for acute illness with uncertain prognosis and prescription drug management.  Final Clinical Impressions(s) / UC Diagnoses   Final diagnoses:  Acute non intractable tension-type headache     Discharge Instructions      -You probably have a tension headache, which can be caused by stress. -I am a little worried about something called temporal arteritis.  This is when the artery in your forehead becomes inflamed.  It is very rare, but very serious.  If untreated, it can cause vision loss. -We are starting you on a medication called prednisone .  Take this until we get lab results back.  If the lab results are normal, you will be able to stop the medication.  If the lab results are positive, you will need to see an ear nose and throat doctor immediately for further management. -Prednisone , 2 pills taken at the same time for 7 days in a row.  Try taking this earlier in the day as it can give you energy. Avoid NSAIDs like ibuprofen  and alleve while taking this medication as they can increase your risk of stomach upset and even GI bleeding when in combination with a steroid. You can continue tylenol  (acetaminophen ) up to 1000mg  3x daily. -We administered a shot of Toradol  today.  This is an NSAID pain medication, similar to ibuprofen . -Because we administered the Toradol  today, please avoid NSAIDs like ibuprofen  or Advil  for the rest of the day. -You can still take Tylenol  1000mg  up to 3x daily     ED Prescriptions     Medication Sig Dispense Auth. Provider   predniSONE  (DELTASONE ) 20 MG tablet Take 2  tablets (40 mg total) by mouth daily for 7 days. Take with breakfast or lunch. Avoid NSAIDs (ibuprofen , etc) while taking this medication. 14 tablet Susann Lawhorne E, PA-C      PDMP not reviewed this encounter.   Arlyss Leita BRAVO, PA-C 07/04/24 1104    Arlyss Leita BRAVO, PA-C 07/04/24 1110
